# Patient Record
Sex: Female | Born: 1965 | Hispanic: No | Marital: Married | Smoking: Never smoker
Health system: Southern US, Community
[De-identification: ages and names within clinical notes are randomized; demographics above are authoritative.]

## PROBLEM LIST (undated history)

## (undated) DIAGNOSIS — E785 Hyperlipidemia, unspecified: Secondary | ICD-10-CM

## (undated) DIAGNOSIS — E119 Type 2 diabetes mellitus without complications: Secondary | ICD-10-CM

## (undated) DIAGNOSIS — M109 Gout, unspecified: Secondary | ICD-10-CM

## (undated) DIAGNOSIS — M25559 Pain in unspecified hip: Secondary | ICD-10-CM

## (undated) DIAGNOSIS — M549 Dorsalgia, unspecified: Secondary | ICD-10-CM

## (undated) DIAGNOSIS — G4730 Sleep apnea, unspecified: Secondary | ICD-10-CM

## (undated) DIAGNOSIS — I10 Essential (primary) hypertension: Secondary | ICD-10-CM

## (undated) DIAGNOSIS — R0602 Shortness of breath: Secondary | ICD-10-CM

## (undated) DIAGNOSIS — E559 Vitamin D deficiency, unspecified: Secondary | ICD-10-CM

## (undated) DIAGNOSIS — Z87448 Personal history of other diseases of urinary system: Secondary | ICD-10-CM

## (undated) DIAGNOSIS — M25569 Pain in unspecified knee: Secondary | ICD-10-CM

## (undated) DIAGNOSIS — M2550 Pain in unspecified joint: Secondary | ICD-10-CM

## (undated) HISTORY — DX: Type 2 diabetes mellitus without complications: E11.9

## (undated) HISTORY — DX: Pain in unspecified knee: M25.569

## (undated) HISTORY — DX: Dorsalgia, unspecified: M54.9

## (undated) HISTORY — DX: Pain in unspecified hip: M25.559

## (undated) HISTORY — DX: Pain in unspecified joint: M25.50

## (undated) HISTORY — DX: Shortness of breath: R06.02

## (undated) HISTORY — DX: Personal history of other diseases of urinary system: Z87.448

## (undated) HISTORY — DX: Hyperlipidemia, unspecified: E78.5

## (undated) HISTORY — DX: Gout, unspecified: M10.9

## (undated) HISTORY — DX: Vitamin D deficiency, unspecified: E55.9

## (undated) HISTORY — DX: Sleep apnea, unspecified: G47.30

## (undated) HISTORY — DX: Essential (primary) hypertension: I10

---

## 2010-06-25 ENCOUNTER — Encounter: Payer: Self-pay | Admitting: Family Medicine

## 2010-06-25 ENCOUNTER — Ambulatory Visit (INDEPENDENT_AMBULATORY_CARE_PROVIDER_SITE_OTHER): Payer: Self-pay | Admitting: Family Medicine

## 2010-06-25 DIAGNOSIS — E785 Hyperlipidemia, unspecified: Secondary | ICD-10-CM | POA: Insufficient documentation

## 2010-06-25 DIAGNOSIS — I10 Essential (primary) hypertension: Secondary | ICD-10-CM | POA: Insufficient documentation

## 2010-06-25 MED ORDER — BENAZEPRIL-HYDROCHLOROTHIAZIDE 5-6.25 MG PO TABS: 1.0000 | ORAL_TABLET | Freq: Every day | ORAL | Status: DC

## 2010-06-25 MED ORDER — BISOPROLOL-HYDROCHLOROTHIAZIDE 5-6.25 MG PO TABS: 1.0000 | ORAL_TABLET | Freq: Every day | ORAL | Status: DC

## 2010-06-25 NOTE — Assessment & Plan Note (Signed)
Unchanged. Advised pt to call when she gets health insurance to get lab work, CPX. The patient indicates understanding of these issues and agrees with the plan. Refilled BP medication today.  Strongly urged BMET today but pt declined.

## 2010-06-25 NOTE — Progress Notes (Signed)
45 yo here to establish care.  Lost her job last year.  Worked for [employer].  Has no insurance so would like to only address HTN today.  HTN- has been on Bisoprolol/HCTZ 06/24/23 mg daily for years. NO CP, SOB, LE edema. No visual changes.    Has not had blood work since 2010 but cannot afford any additional medical testing at this time.  The PMH, PSH, Social History, Family History, Medications, and allergies have been reviewed in [facility], and have been updated if relevant.  ROS: See HPI Patient reports no vision/ hearing  changes, adenopathy,fever, weight change,  persistant / recurrent hoarseness , swallowing issues, chest pain,palpitations,edema,persistant /recurrent cough, hemoptysis, dyspnea( rest/ exertional/paroxysmal nocturnal), gastrointestinal bleeding(melena, rectal bleeding), abdominal pain, significant heartburn boel changes,GU symptoms(dysuria, hematuria,pyuria, incontinence) ), Gyn symptoms(abnormal  bleeding , pain),  syncope, focal weakness, memory loss,numbness & tingling, skin/hair /nail changes,abnormal bruising or bleeding, anxiety,or depression.  Physical exam: BP 138/90  Pulse 71  Temp(Src) 97.6 F (36.4 C) (Oral)  Ht 5\' 4"  (1.626 m)  Wt 254 lb 12.8 oz (115.577 kg)  BMI 43.74 kg/m2  LMP 05/20/2010 Gen:   Alert, overweight appearing, NAD head:  normocephalic and atraumatic.   Eyes:  vision grossly intact, pupils equal, pupils round, and pupils reactive to light.     Nose:  no external deformity.   Mouth:  good dentition.   Neck:  No deformities, masses, or tenderness noted. Breasts:  No mass, nodules, thickening, tenderness, bulging, retraction, inflamation, nipple discharge or skin changes noted.   Lungs:  Normal respiratory effort, chest expands symmetrically. Lungs are clear to auscultation, no crackles or wheezes. Heart:  Normal rate and regular rhythm. S1 and S2 normal without gallop, murmur, click, rub or other extra sounds. Abdomen:  Bowel  sounds positive,abdomen soft and non-tender without masses, organomegaly or hernias noted. Extremities:  No clubbing, cyanosis, edema, or deformity noted with normal full range of motion of all joints.   Neurologic:  alert & oriented X3 and gait normal.   Skin:  Intact without suspicious lesions or rashes Psych:  Cognition and judgment appear intact. Alert and cooperative with normal attention span and concentration. No apparent delusions, illusions, hallucinations

## 2011-06-12 ENCOUNTER — Other Ambulatory Visit: Payer: Self-pay | Admitting: Family Medicine

## 2011-10-04 ENCOUNTER — Encounter: Payer: Self-pay | Admitting: Family Medicine

## 2011-10-04 ENCOUNTER — Ambulatory Visit (INDEPENDENT_AMBULATORY_CARE_PROVIDER_SITE_OTHER): Payer: BC Managed Care – PPO | Admitting: Family Medicine

## 2011-10-04 VITALS — BP 118/82 | HR 56 | Temp 97.8°F | Wt 229.0 lb

## 2011-10-04 DIAGNOSIS — E785 Hyperlipidemia, unspecified: Secondary | ICD-10-CM

## 2011-10-04 DIAGNOSIS — Z1231 Encounter for screening mammogram for malignant neoplasm of breast: Secondary | ICD-10-CM

## 2011-10-04 DIAGNOSIS — I10 Essential (primary) hypertension: Secondary | ICD-10-CM

## 2011-10-04 MED ORDER — BISOPROLOL-HYDROCHLOROTHIAZIDE 5-6.25 MG PO TABS: ORAL_TABLET | ORAL | Status: DC

## 2011-10-04 NOTE — Progress Notes (Signed)
46 yo here for follow up HTN.   HTN- has been on Bisoprolol/HCTZ 06/24/23 mg daily for years. NO CP, SOB, LE edema. No visual changes.    She has lost 30 pounds on weight watchers!  Feels great! Wt Readings from Last 3 Encounters:  10/04/11 229 lb (103.874 kg)  06/25/10 254 lb 12.8 oz (115.577 kg)   Patient Active Problem List  Diagnosis  . Hypertension  . Hyperlipidemia   Past Medical History  Diagnosis Date  . Hypertension   . Hyperlipidemia    No past surgical history on file. History  Substance Use Topics  . Smoking status: Never Smoker   . Smokeless tobacco: Not on file  . Alcohol Use: Not on file   Family History  Problem Relation Age of Onset  . Pulmonary embolism Father   . Pulmonary embolism Brother    Allergies  Allergen Reactions  . Penicillins Rash    Reaction as a child   Current Outpatient Prescriptions on File Prior to Visit  Medication Sig Dispense Refill  . aspirin 81 MG tablet Take 81 mg by mouth daily.        Marland Kitchen DISCONTD: bisoprolol-hydrochlorothiazide (ZIAC) 5-6.25 MG per tablet TAKE ONE TABLET BY MOUTH ONE TIME DAILY  90 tablet  0     The PMH, PSH, Social History, Family History, Medications, and allergies have been reviewed in [facility], and have been updated if relevant.  ROS: See HPI Patient reports no vision/ hearing  changes, adenopathy,fever, weight change,  persistant / recurrent hoarseness , swallowing issues, chest pain,palpitations,edema,persistant /recurrent cough, hemoptysis, dyspnea( rest/ exertional/paroxysmal nocturnal), gastrointestinal bleeding(melena, rectal bleeding), abdominal pain, significant heartburn boel changes,GU symptoms(dysuria, hematuria,pyuria, incontinence) ), Gyn symptoms(abnormal  bleeding , pain),  syncope, focal weakness, memory loss,numbness & tingling, skin/hair /nail changes,abnormal bruising or bleeding, anxiety,or depression.  Physical exam: BP 118/82  Pulse 56  Temp 97.8 F (36.6 C)  Wt 229 lb (103.874  kg) Gen:   Alert, overweight appearing, NAD head:  normocephalic and atraumatic.   Eyes:  vision grossly intact, pupils equal, pupils round, and pupils reactive to light.    Nose:  no external deformity.   Mouth:  good dentition.   Neck:  No deformities, masses, or tenderness noted.  Lungs:  Normal respiratory effort, chest expands symmetrically. Lungs are clear to auscultation, no crackles or wheezes. Heart:  Normal rate and regular rhythm. S1 and S2 normal without gallop, murmur, click, rub or other extra sounds. Psych:  Cognition and judgment appear intact. Alert and cooperative with normal attention span and concentration. No apparent delusions, illusions, hallucinations  Assessment and Plan: 1. Hypertension  Stable. Meds refilled. Congratulated her on weight loss. Comprehensive metabolic panel  2. Hyperlipidemia  Lipid Panel  3. Other screening mammogram  MM Digital Screening

## 2011-10-04 NOTE — Patient Instructions (Addendum)
Please stop by to see Olivia Moreno (up front)- she will set up your mammogram. Please make an appt for a physical. We will call you with your lab results from today. Congratulations on your weight loss!

## 2011-10-05 LAB — COMPREHENSIVE METABOLIC PANEL
ALT: 19 U/L (ref 0–35)
CO2: 29 mEq/L (ref 19–32)
Calcium: 10.1 mg/dL (ref 8.4–10.5)
Chloride: 103 mEq/L (ref 96–112)
Creat: 0.83 mg/dL (ref 0.50–1.10)
Glucose, Bld: 96 mg/dL (ref 70–99)
Total Protein: 7.1 g/dL (ref 6.0–8.3)

## 2011-10-05 LAB — LIPID PANEL
Cholesterol: 195 mg/dL (ref 0–200)
Total CHOL/HDL Ratio: 4.9 Ratio
Triglycerides: 229 mg/dL — ABNORMAL HIGH (ref <150)

## 2011-10-08 ENCOUNTER — Other Ambulatory Visit: Payer: BC Managed Care – PPO

## 2011-10-15 ENCOUNTER — Other Ambulatory Visit (HOSPITAL_COMMUNITY)
Admission: RE | Admit: 2011-10-15 | Discharge: 2011-10-15 | Disposition: A | Discharge status: Home / Self Care | Payer: BC Managed Care – PPO | Source: Ambulatory Visit | Attending: Family Medicine | Admitting: Family Medicine

## 2011-10-15 ENCOUNTER — Encounter: Payer: Self-pay | Admitting: Family Medicine

## 2011-10-15 ENCOUNTER — Ambulatory Visit (INDEPENDENT_AMBULATORY_CARE_PROVIDER_SITE_OTHER): Payer: BC Managed Care – PPO | Admitting: Family Medicine

## 2011-10-15 VITALS — BP 130/94 | HR 68 | Temp 98.1°F | Ht <= 58 in | Wt 223.0 lb

## 2011-10-15 DIAGNOSIS — Z23 Encounter for immunization: Secondary | ICD-10-CM

## 2011-10-15 DIAGNOSIS — Z01419 Encounter for gynecological examination (general) (routine) without abnormal findings: Secondary | ICD-10-CM | POA: Insufficient documentation

## 2011-10-15 DIAGNOSIS — I10 Essential (primary) hypertension: Secondary | ICD-10-CM

## 2011-10-15 DIAGNOSIS — Z0000 Encounter for general adult medical examination without abnormal findings: Secondary | ICD-10-CM

## 2011-10-15 DIAGNOSIS — Z1151 Encounter for screening for human papillomavirus (HPV): Secondary | ICD-10-CM | POA: Insufficient documentation

## 2011-10-15 DIAGNOSIS — Z139 Encounter for screening, unspecified: Secondary | ICD-10-CM

## 2011-10-15 DIAGNOSIS — Z113 Encounter for screening for infections with a predominantly sexual mode of transmission: Secondary | ICD-10-CM | POA: Insufficient documentation

## 2011-10-15 DIAGNOSIS — E785 Hyperlipidemia, unspecified: Secondary | ICD-10-CM

## 2011-10-15 NOTE — Progress Notes (Signed)
46 yo here for CPX.  G1P1, no h/o abnormal pap smears.  She has only had one period this year- thinks she is now menopausal. Having some mild hot flashes. No vaginal dryness.  HTN- has been on Bisoprolol/HCTZ 06/24/23 mg daily for years. NO CP, SOB, LE edema. No visual changes.    She has lost over 30 pounds on weight watchers!  Feels great! Wt Readings from Last 3 Encounters:  10/15/11 223 lb (101.152 kg)  10/04/11 229 lb (103.874 kg)  06/25/10 254 lb 12.8 oz (115.577 kg)    Mammogram scheduled for 10/24/2011.  Hypertriglyceridemia- Lab Results  Component Value Date   CHOL 195 10/04/2011   HDL 40 10/04/2011   LDLCALC 109* 10/04/2011   TRIG 229* 10/04/2011   CHOLHDL 4.9 10/04/2011    Patient Active Problem List  Diagnosis  . Hypertension  . Hyperlipidemia  . Routine general medical examination at a health care facility   Past Medical History  Diagnosis Date  . Hypertension   . Hyperlipidemia    No past surgical history on file. History  Substance Use Topics  . Smoking status: Never Smoker   . Smokeless tobacco: Not on file  . Alcohol Use: Not on file   Family History  Problem Relation Age of Onset  . Pulmonary embolism Father   . Pulmonary embolism Brother    Allergies  Allergen Reactions  . Penicillins Rash    Reaction as a child   Current Outpatient Prescriptions on File Prior to Visit  Medication Sig Dispense Refill  . aspirin 81 MG tablet Take 81 mg by mouth daily.        . bisoprolol-hydrochlorothiazide (ZIAC) 5-6.25 MG per tablet TAKE ONE TABLET BY MOUTH ONE TIME DAILY  90 tablet  6     The PMH, PSH, Social History, Family History, Medications, and allergies have been reviewed in [facility], and have been updated if relevant.  ROS: See HPI Patient reports no vision/ hearing  changes, adenopathy,fever, weight change,  persistant / recurrent hoarseness , swallowing issues, chest pain,palpitations,edema,persistant /recurrent cough, hemoptysis, dyspnea( rest/  exertional/paroxysmal nocturnal), gastrointestinal bleeding(melena, rectal bleeding), abdominal pain, significant heartburn boel changes,GU symptoms(dysuria, hematuria,pyuria, incontinence) ), Gyn symptoms(abnormal  bleeding , pain),  syncope, focal weakness, memory loss,numbness & tingling, skin/hair /nail changes,abnormal bruising or bleeding, anxiety,or depression.  Physical exam: BP 130/94  Pulse 68  Temp 98.1 F (36.7 C)  Ht 4' 4.5" (1.334 m)  Wt 223 lb (101.152 kg)  BMI 56.88 kg/m2  General:  Well-developed,well-nourished,in no acute distress; alert,appropriate and cooperative throughout examination Head:  normocephalic and atraumatic.   Eyes:  vision grossly intact, pupils equal, pupils round, and pupils reactive to light.   Ears:  R ear normal and L ear normal.   Nose:  no external deformity.   Mouth:  good dentition.   Neck:  No deformities, masses, or tenderness noted. Breasts:  No mass, nodules, thickening, tenderness, bulging, retraction, inflamation, nipple discharge or skin changes noted.   Lungs:  Normal respiratory effort, chest expands symmetrically. Lungs are clear to auscultation, no crackles or wheezes. Heart:  Normal rate and regular rhythm. S1 and S2 normal without gallop, murmur, click, rub or other extra sounds. Abdomen:  Bowel sounds positive,abdomen soft and non-tender without masses, organomegaly or hernias noted. Rectal:  no external abnormalities.   Genitalia:  Pelvic Exam:        External: normal female genitalia without lesions or masses        Vagina: normal without lesions or  masses        Cervix: normal without lesions or masses        Adnexa: normal bimanual exam without masses or fullness        Uterus: normal by palpation        Pap smear: performed Msk:  No deformity or scoliosis noted of thoracic or lumbar spine.   Extremities:  No clubbing, cyanosis, edema, or deformity noted with normal full range of motion of all joints.   Neurologic:  alert &  oriented X3 and gait normal.   Skin:  Intact without suspicious lesions or rashes Cervical Nodes:  No lymphadenopathy noted Axillary Nodes:  No palpable lymphadenopathy Psych:  Cognition and judgment appear intact. Alert and cooperative with normal attention span and concentration. No apparent delusions, illusions, hallucinations   Assessment and Plan:  1. Routine general medical examination at a health care facility  Reviewed preventive care protocols, scheduled due services, and updated immunizations Discussed nutrition, exercise, diet, and healthy lifestyle.   2. Hyperlipidemia  Deteriorated. TG elevated.   Hopefully will improve with continued weight loss. Will recheck lipids in 3-6 months.  3. Hypertension  Stable on current meds.  4. Routine gynecological examination  Pap today. Pt requests screening GC/Chlamydia as well- no known exposure to STDs or symptoms.

## 2011-10-15 NOTE — Patient Instructions (Addendum)
Great to see you. Your triglycerides were a little elevated.  Weight loss (even small amount) can decrease triglycerides.  Decrease added sugars, eliminate trans fats, increase fiber and limit alcohol.  All these changes together can drop triglycerides by almost 50%. Please come back in 3-6 months to recheck your cholesterol.  We will call you or send you a letter (if normal) with your pap smear results.

## 2011-10-15 NOTE — Addendum Note (Signed)
Addended by: Eliezer Bottom on: 10/15/2011 10:05 AM   Modules accepted: Orders

## 2011-10-22 ENCOUNTER — Encounter: Payer: Self-pay | Admitting: Family Medicine

## 2011-10-22 ENCOUNTER — Encounter: Payer: Self-pay | Admitting: *Deleted

## 2011-10-24 ENCOUNTER — Encounter: Payer: Self-pay | Admitting: Family Medicine

## 2011-10-24 ENCOUNTER — Encounter: Payer: Self-pay | Admitting: *Deleted

## 2012-03-06 ENCOUNTER — Other Ambulatory Visit (INDEPENDENT_AMBULATORY_CARE_PROVIDER_SITE_OTHER): Payer: BC Managed Care – PPO

## 2012-03-06 DIAGNOSIS — E785 Hyperlipidemia, unspecified: Secondary | ICD-10-CM

## 2012-03-06 LAB — LIPID PANEL
HDL: 37.1 mg/dL — ABNORMAL LOW (ref >39.00)
LDL Cholesterol: 129 mg/dL — ABNORMAL HIGH (ref 0–99)
Total CHOL/HDL Ratio: 5
Triglycerides: 172 mg/dL — ABNORMAL HIGH (ref 0.0–149.0)

## 2012-03-06 NOTE — Addendum Note (Signed)
Addended by: Baldomero Lamy on: 03/06/2012 08:04 AM   Modules accepted: Orders

## 2012-08-12 ENCOUNTER — Ambulatory Visit (INDEPENDENT_AMBULATORY_CARE_PROVIDER_SITE_OTHER): Payer: BC Managed Care – PPO | Admitting: Family Medicine

## 2012-08-12 ENCOUNTER — Encounter: Payer: Self-pay | Admitting: Family Medicine

## 2012-08-12 VITALS — BP 140/98 | HR 98 | Temp 98.0°F | Wt 254.0 lb

## 2012-08-12 DIAGNOSIS — R21 Rash and other nonspecific skin eruption: Secondary | ICD-10-CM | POA: Insufficient documentation

## 2012-08-12 DIAGNOSIS — R609 Edema, unspecified: Secondary | ICD-10-CM | POA: Insufficient documentation

## 2012-08-12 DIAGNOSIS — R600 Localized edema: Secondary | ICD-10-CM | POA: Insufficient documentation

## 2012-08-12 NOTE — Progress Notes (Signed)
  Subjective:    Patient ID: Olivia Moreno, female    DOB: 15-Nov-1965, 47 y.o.   MRN: [id number]  HPI 47 year old overweight female presents with B peripheral edema x 2 months. When swelling was occuring she felt is was venous insufficiency.   In last 24 hour she has noted a rash on B ankles and feet..  No itchiness. Feeling well otherwise.  No SOB, No CP, No fever. No joint swelling. No change in urination. She has some fatigue, hot intolerance. HAs regained 30 lbs in last 8 months.  no new exposures or lotion.  Nml liver and kidney function test in 09/2011.  No thyroid.  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  No family history of thyroid issues.    Review of Systems  Constitutional: Negative for fever and fatigue.  HENT: Negative for ear pain.   Eyes: Negative for pain.  Respiratory: Negative for chest tightness and shortness of breath.   Cardiovascular: Negative for chest pain and palpitations.  Gastrointestinal: Negative for abdominal pain.  Genitourinary: Negative for dysuria.       Objective:   Physical Exam  Constitutional: Vital signs are normal. She appears well-developed and well-nourished. She is cooperative.  Non-toxic appearance. She does not appear ill. No distress.  HENT:  Head: Normocephalic.  Right Ear: Hearing, tympanic membrane, external ear and ear canal normal. Tympanic membrane is not erythematous, not retracted and not bulging.  Left Ear: Hearing, tympanic membrane, external ear and ear canal normal. Tympanic membrane is not erythematous, not retracted and not bulging.  Nose: No mucosal edema or rhinorrhea. Right sinus exhibits no maxillary sinus tenderness and no frontal sinus tenderness. Left sinus exhibits no maxillary sinus tenderness and no frontal sinus tenderness.  Mouth/Throat: Uvula is midline, oropharynx is clear and moist and mucous membranes are normal.  Eyes: Conjunctivae, EOM and lids are normal. Pupils are equal, round,  and reactive to light. No foreign bodies found.  Neck: Trachea normal and normal range of motion. Neck supple. Carotid bruit is not present. No mass and no thyromegaly present.  Cardiovascular: Normal rate, regular rhythm, S1 normal, S2 normal, normal heart sounds, intact distal pulses and normal pulses.  Exam reveals no gallop and no friction rub.   No murmur heard. Pulmonary/Chest: Effort normal and breath sounds normal. Not tachypneic. No respiratory distress. She has no decreased breath sounds. She has no wheezes. She has no rhonchi. She has no rales.  Abdominal: Soft. Normal appearance and bowel sounds are normal. There is no tenderness.  Neurological: She is alert.  Skin: Skin is warm, dry and intact. No rash noted.  1 plus nonpitting edema in B legs Nonblanching pinpoint rash on B ankles  Psychiatric: Her speech is normal and behavior is normal. Judgment and thought content normal. Her mood appears not anxious. Cognition and memory are normal. She does not exhibit a depressed mood.          Assessment & Plan:

## 2012-08-12 NOTE — Patient Instructions (Addendum)
Stop at the lab.. We will call you with those results.  Start wearing compression hose.. 15-30 mmHG.  Elevate feet above heart.  Work on exercise and weight loss.

## 2012-08-13 LAB — CBC WITH DIFFERENTIAL/PLATELET
Basophils Absolute: 0 10*3/uL (ref 0.0–0.1)
Eosinophils Absolute: 0.2 10*3/uL (ref 0.0–0.7)
Lymphocytes Relative: 22.5 % (ref 12.0–46.0)
MCHC: 33.5 g/dL (ref 30.0–36.0)
MCV: 92.3 fl (ref 78.0–100.0)
Monocytes Absolute: 0.4 10*3/uL (ref 0.1–1.0)
Neutro Abs: 6.9 10*3/uL (ref 1.4–7.7)
Neutrophils Relative %: 70.5 % (ref 43.0–77.0)
RDW: 13.9 % (ref 11.5–14.6)

## 2012-08-13 LAB — COMPREHENSIVE METABOLIC PANEL
ALT: 29 U/L (ref 0–35)
AST: 30 U/L (ref 0–37)
Alkaline Phosphatase: 82 U/L (ref 39–117)
Calcium: 9.9 mg/dL (ref 8.4–10.5)
Chloride: 104 mEq/L (ref 96–112)
Creatinine, Ser: 1 mg/dL (ref 0.4–1.2)

## 2012-08-20 NOTE — Assessment & Plan Note (Addendum)
Nonblanching rash on legs that appear secondary to peripheral edema. WIll eval with labs.   Could possibbly be a vasculitis so if progressing consider derm referral.

## 2012-08-20 NOTE — Assessment & Plan Note (Signed)
Will eval with labs to rule out kidney, liver , other causes. Most likely venous insufficiency. Elevate feet, compression hose, exercise and weight loss.

## 2012-10-15 ENCOUNTER — Telehealth: Payer: Self-pay

## 2012-10-15 NOTE — Telephone Encounter (Signed)
Pt wanted to schedule screening mammogram appt; advised pt she can schedule herself and will better coordinate appt with pt's schedule. Pt will call to schedule. If any problems pt will cb.

## 2012-12-24 ENCOUNTER — Other Ambulatory Visit: Payer: Self-pay

## 2012-12-27 ENCOUNTER — Other Ambulatory Visit: Payer: Self-pay | Admitting: Family Medicine

## 2012-12-28 NOTE — Telephone Encounter (Signed)
Last office visit 08/12/2012 with Dr. Ermalene Searing.  Ok to refill?

## 2013-01-19 ENCOUNTER — Ambulatory Visit (INDEPENDENT_AMBULATORY_CARE_PROVIDER_SITE_OTHER): Payer: BC Managed Care – PPO | Admitting: Family Medicine

## 2013-01-19 ENCOUNTER — Encounter: Payer: Self-pay | Admitting: Family Medicine

## 2013-01-19 ENCOUNTER — Ambulatory Visit: Payer: Self-pay | Admitting: Family Medicine

## 2013-01-19 ENCOUNTER — Telehealth: Payer: Self-pay

## 2013-01-19 VITALS — BP 142/90 | HR 88 | Temp 98.7°F | Ht 62.5 in | Wt 262.5 lb

## 2013-01-19 DIAGNOSIS — M7989 Other specified soft tissue disorders: Secondary | ICD-10-CM

## 2013-01-19 DIAGNOSIS — J209 Acute bronchitis, unspecified: Secondary | ICD-10-CM | POA: Insufficient documentation

## 2013-01-19 MED ORDER — GUAIFENESIN-CODEINE 100-10 MG/5ML PO SYRP: 5.0000 mL | ORAL_SOLUTION | Freq: Every evening | ORAL | Status: DC | PRN

## 2013-01-19 MED ORDER — DICLOFENAC SODIUM 75 MG PO TBEC: 75.0000 mg | DELAYED_RELEASE_TABLET | Freq: Two times a day (BID) | ORAL | Status: DC

## 2013-01-19 MED ORDER — AZITHROMYCIN 250 MG PO TABS: ORAL_TABLET | ORAL | Status: DC

## 2013-01-19 NOTE — Progress Notes (Signed)
   Subjective:    Patient ID: Olivia Moreno, female    DOB: [birth year], 47 y.o.   MRN: [id number]  HPI  47 year old  Morbidly obese pt with HTN of Dr. Elmer Sow presents with  New onset pain and swelling in left ankle.  No known injury. No fall. Pain is over left lateral ankle, no calf or thigh pain.  Pain with dorsiflexion of ankle.  Some warmth and slight redness. Pain with weight bearing. Went to [place] over thanksgiving... 5 hours each way.  She has been walking a lot during her trip.  Father with DVTs and died from PE and brother with DVTs.    She has also been having 1 day runny nose, congestion , fatgiue. She has had a cough off and on for three weeks. No headache, no face pain, no era pain.  She now has thick and yellow mucus productive cough.  No fever.  No SOB, no wheeze.  Non smoker, no COPD, no asthma.  SUing nasal saline, zicam and nyquil.      Review of Systems  Constitutional: Negative for fever and fatigue.  HENT: Positive for congestion. Negative for ear pain.   Eyes: Negative for pain.  Respiratory: Positive for cough. Negative for chest tightness and shortness of breath.   Cardiovascular: Positive for leg swelling. Negative for chest pain and palpitations.  Gastrointestinal: Negative for abdominal pain.  Genitourinary: Negative for dysuria.       Objective:   Physical Exam  Constitutional: Vital signs are normal. She appears well-developed and well-nourished. She is cooperative.  Non-toxic appearance. She does not appear ill. No distress.  Morbid obesity  HENT:  Head: Normocephalic.  Right Ear: Hearing, tympanic membrane, external ear and ear canal normal. Tympanic membrane is not erythematous, not retracted and not bulging.  Left Ear: Hearing, tympanic membrane, external ear and ear canal normal. Tympanic membrane is not erythematous, not retracted and not bulging.  Nose: Mucosal edema and rhinorrhea present. Right sinus exhibits no maxillary sinus tenderness  and no frontal sinus tenderness. Left sinus exhibits no maxillary sinus tenderness and no frontal sinus tenderness.  Mouth/Throat: Uvula is midline, oropharynx is clear and moist and mucous membranes are normal.  Eyes: Conjunctivae, EOM and lids are normal. Pupils are equal, round, and reactive to light. Lids are everted and swept, no foreign bodies found.  Neck: Trachea normal and normal range of motion. Neck supple. Carotid bruit is not present. No mass and no thyromegaly present.  Cardiovascular: Normal rate, regular rhythm, S1 normal, S2 normal, normal heart sounds, intact distal pulses and normal pulses.  Exam reveals no gallop and no friction rub.   No murmur heard. Pulmonary/Chest: Effort normal and breath sounds normal. Not tachypneic. No respiratory distress. She has no decreased breath sounds. She has no wheezes. She has no rhonchi. She has no rales.  Musculoskeletal:  Left leg red and swollen calf to foot.  PAin in dorsal foot over talus anteriorly. No pain over lateral or medial malleolus  Neurological: She is alert.  Skin: Skin is warm, dry and intact. No rash noted.  Psychiatric: Her speech is normal and behavior is normal. Judgment normal. Her mood appears not anxious. Cognition and memory are normal. She does not exhibit a depressed mood.          Assessment & Plan:

## 2013-01-19 NOTE — Assessment & Plan Note (Signed)
Will eval for DVT given pt family history of recent travel as well as obesity.   May be secondary to ankle injury... If Dopplers negative will treat with brace elevation, ice and NSAIDs.

## 2013-01-19 NOTE — Progress Notes (Signed)
Pre-visit discussion using our clinic review tool. No additional management support is needed unless otherwise documented below in the visit note.  

## 2013-01-19 NOTE — Telephone Encounter (Signed)
I sent in diclofenac to use BID. Please mail her ankle sprain rehab exercises.

## 2013-01-19 NOTE — Telephone Encounter (Signed)
Amber with [facility] [place] called report  Lt leg [place] was negative for blood clot and no Baker's Cyst. Pt waiting. Dr Ermalene Searing said advise pt everything was fine;Musculoskeletal. Dr Ermalene Searing will call in antiinflammatory medication, pt should elevate foot and ice ankle. Pt to get pull on soft ankle brace for support at pharmacy and [facility] will mail home PT to patient. If no improvement in 2 weeks pt to cb for f/u appt.

## 2013-01-19 NOTE — Patient Instructions (Signed)
Mucinex DM during the day. Prescription cough suppressant at night. Start and complete antibiotics. Stop at the front desk to set up doppler US of left leg today.

## 2013-01-20 ENCOUNTER — Encounter: Payer: Self-pay | Admitting: Family Medicine

## 2013-01-20 NOTE — Telephone Encounter (Signed)
Patient notified as instructed by telelphone.  Sprain Ankle Rehab Exercises mailed.

## 2013-04-22 ENCOUNTER — Other Ambulatory Visit: Payer: Self-pay | Admitting: Internal Medicine

## 2013-07-26 ENCOUNTER — Telehealth: Payer: Self-pay | Admitting: Family Medicine

## 2013-07-26 MED ORDER — BISOPROLOL-HYDROCHLOROTHIAZIDE 5-6.25 MG PO TABS: 1.0000 | ORAL_TABLET | Freq: Every day | ORAL | Status: DC

## 2013-07-26 NOTE — Telephone Encounter (Signed)
Lm on pts vm informing her 30D supply sent to local pharmacy to last until f/u appt

## 2013-07-26 NOTE — Telephone Encounter (Signed)
[facility] [employer]  Pt called to make a med refill appointment  Appointment 08/13/13 @ 4 Pt stated she would be out of her bp meds prior to that appointment and wanted to know if she could get her meds prior to 6/26

## 2013-08-13 ENCOUNTER — Encounter: Payer: Self-pay | Admitting: Family Medicine

## 2013-08-13 ENCOUNTER — Ambulatory Visit (INDEPENDENT_AMBULATORY_CARE_PROVIDER_SITE_OTHER): Payer: BC Managed Care – PPO | Admitting: Family Medicine

## 2013-08-13 VITALS — BP 144/88 | HR 86 | Temp 97.7°F | Ht 62.75 in | Wt 260.5 lb

## 2013-08-13 DIAGNOSIS — M109 Gout, unspecified: Secondary | ICD-10-CM | POA: Insufficient documentation

## 2013-08-13 DIAGNOSIS — M10071 Idiopathic gout, right ankle and foot: Secondary | ICD-10-CM

## 2013-08-13 DIAGNOSIS — M7989 Other specified soft tissue disorders: Secondary | ICD-10-CM

## 2013-08-13 DIAGNOSIS — I10 Essential (primary) hypertension: Secondary | ICD-10-CM

## 2013-08-13 MED ORDER — GUAIFENESIN-CODEINE 100-10 MG/5ML PO SYRP: 5.0000 mL | ORAL_SOLUTION | Freq: Every evening | ORAL | Status: DC | PRN

## 2013-08-13 MED ORDER — BISOPROLOL-HYDROCHLOROTHIAZIDE 5-6.25 MG PO TABS: 1.0000 | ORAL_TABLET | Freq: Every day | ORAL | Status: DC

## 2013-08-13 MED ORDER — DICLOFENAC SODIUM 75 MG PO TBEC: 75.0000 mg | DELAYED_RELEASE_TABLET | Freq: Two times a day (BID) | ORAL | Status: DC

## 2013-08-13 NOTE — Progress Notes (Signed)
Pre visit review using our clinic review tool, if applicable. No additional management support is needed unless otherwise documented below in the visit note. 

## 2013-08-13 NOTE — Assessment & Plan Note (Signed)
Reasonable control on current rx. Advised her against increasing diuretic for LE given her h/o gout.

## 2013-08-13 NOTE — Assessment & Plan Note (Signed)
Venous insufficiency. Advised compression hose. See below.

## 2013-08-13 NOTE — Progress Notes (Signed)
Subjective:    Patient ID: Olivia Moreno, female    DOB: 10/20/1965, 48 y.o.   MRN: [id number]  HPI 48 year old overweight female presents for med refills with worsening  B peripheral edema for past year.  Labs unremarkable last winter- felt it was venous insufficiency.  Much better in morning.  Sits all day at work but does not elevate her feel. No CP or SOB.  Lab Results  Component Value Date   WBC 9.8 08/12/2012   HGB 14.6 08/12/2012   HCT 43.5 08/12/2012   MCV 92.3 08/12/2012   PLT 260.0 08/12/2012   Lab Results  Component Value Date   TSH 2.64 08/12/2012   Lab Results  Component Value Date   NA 139 08/12/2012   K 4.4 08/12/2012   CL 104 08/12/2012   CO2 27 08/12/2012    Does have h/o gout.  Wants to increase her diuretic in her BP medication.    Review of Systems  Constitutional: Negative for fever and fatigue.  HENT: Negative for ear pain.   Eyes: Negative for pain.  Respiratory: Negative for chest tightness and shortness of breath.   Cardiovascular: Negative for chest pain and palpitations.  Gastrointestinal: Negative for abdominal pain.  Genitourinary: Negative for dysuria.       Objective:   Physical Exam  Constitutional: Vital signs are normal. She appears well-developed and well-nourished. She is cooperative.  Non-toxic appearance. She does not appear ill. No distress.  HENT:  Head: Normocephalic.  Right Ear: Hearing, tympanic membrane, external ear and ear canal normal. Tympanic membrane is not erythematous, not retracted and not bulging.  Left Ear: Hearing, tympanic membrane, external ear and ear canal normal. Tympanic membrane is not erythematous, not retracted and not bulging.  Nose: No mucosal edema or rhinorrhea. Right sinus exhibits no maxillary sinus tenderness and no frontal sinus tenderness. Left sinus exhibits no maxillary sinus tenderness and no frontal sinus tenderness.  Mouth/Throat: Uvula is midline, oropharynx is clear and moist and mucous  membranes are normal.  Eyes: Conjunctivae, EOM and lids are normal. Pupils are equal, round, and reactive to light. Lids are everted and swept, no foreign bodies found.  Neck: Trachea normal and normal range of motion. Neck supple. Carotid bruit is not present. No mass and no thyromegaly present.  Cardiovascular: Normal rate, regular rhythm, S1 normal, S2 normal, normal heart sounds, intact distal pulses and normal pulses.  Exam reveals no gallop and no friction rub.   No murmur heard. Pulmonary/Chest: Effort normal and breath sounds normal. Not tachypneic. No respiratory distress. She has no decreased breath sounds. She has no wheezes. She has no rhonchi. She has no rales.  Abdominal: Soft. Normal appearance and bowel sounds are normal. There is no tenderness.  Neurological: She is alert.  Skin: Skin is warm, dry and intact. No rash noted.  1 plus nonpitting edema in B legs Nonblanching pinpoint rash on B ankles  Psychiatric: Her speech is normal and behavior is normal. Judgment and thought content normal. Her mood appears not anxious. Cognition and memory are normal. She does not exhibit a depressed mood.  BP 144/88  Pulse 86  Temp(Src) 97.7 F (36.5 C) (Oral)  Ht 5' 2.75" (1.594 m)  Wt 260 lb 8 oz (118.162 kg)  BMI 46.51 kg/m2  SpO2 95%  LMP 05/20/2010 BP Readings from Last 3 Encounters:  08/13/13 144/88  01/19/13 142/90  08/12/12 140/98           Assessment & Plan:

## 2013-08-16 ENCOUNTER — Telehealth: Payer: Self-pay | Admitting: Family Medicine

## 2013-08-16 NOTE — Telephone Encounter (Signed)
Relevant patient education assigned to patient using Emmi. ° °

## 2013-11-08 ENCOUNTER — Other Ambulatory Visit (INDEPENDENT_AMBULATORY_CARE_PROVIDER_SITE_OTHER): Payer: BC Managed Care – PPO

## 2013-11-08 DIAGNOSIS — Z0000 Encounter for general adult medical examination without abnormal findings: Secondary | ICD-10-CM

## 2013-11-08 DIAGNOSIS — I10 Essential (primary) hypertension: Secondary | ICD-10-CM

## 2013-11-08 DIAGNOSIS — E785 Hyperlipidemia, unspecified: Secondary | ICD-10-CM

## 2013-11-08 LAB — COMPREHENSIVE METABOLIC PANEL
ALBUMIN: 3.9 g/dL (ref 3.5–5.2)
ALK PHOS: 80 U/L (ref 39–117)
ALT: 38 U/L — AB (ref 0–35)
AST: 38 U/L — ABNORMAL HIGH (ref 0–37)
BUN: 23 mg/dL (ref 6–23)
CO2: 29 mEq/L (ref 19–32)
Calcium: 9.4 mg/dL (ref 8.4–10.5)
Chloride: 109 mEq/L (ref 96–112)
Creatinine, Ser: 1 mg/dL (ref 0.4–1.2)
GFR: 59.85 mL/min — ABNORMAL LOW (ref >60.00)
Glucose, Bld: 138 mg/dL — ABNORMAL HIGH (ref 70–99)
POTASSIUM: 4.2 meq/L (ref 3.5–5.1)
SODIUM: 143 meq/L (ref 135–145)
TOTAL PROTEIN: 7.5 g/dL (ref 6.0–8.3)
Total Bilirubin: 0.3 mg/dL (ref 0.2–1.2)

## 2013-11-08 LAB — CBC WITH DIFFERENTIAL/PLATELET
BASOS PCT: 0.5 % (ref 0.0–3.0)
Basophils Absolute: 0 10*3/uL (ref 0.0–0.1)
Eosinophils Absolute: 0.2 10*3/uL (ref 0.0–0.7)
Eosinophils Relative: 2.3 % (ref 0.0–5.0)
HEMATOCRIT: 40.8 % (ref 36.0–46.0)
Hemoglobin: 13.5 g/dL (ref 12.0–15.0)
LYMPHS ABS: 1.9 10*3/uL (ref 0.7–4.0)
Lymphocytes Relative: 23.2 % (ref 12.0–46.0)
MCHC: 33 g/dL (ref 30.0–36.0)
MCV: 92.8 fl (ref 78.0–100.0)
MONO ABS: 0.5 10*3/uL (ref 0.1–1.0)
Monocytes Relative: 6.5 % (ref 3.0–12.0)
Neutro Abs: 5.6 10*3/uL (ref 1.4–7.7)
Neutrophils Relative %: 67.5 % (ref 43.0–77.0)
Platelets: 230 10*3/uL (ref 150.0–400.0)
RBC: 4.4 Mil/uL (ref 3.87–5.11)
RDW: 14.6 % (ref 11.5–15.5)
WBC: 8.2 10*3/uL (ref 4.0–10.5)

## 2013-11-08 LAB — LIPID PANEL
Cholesterol: 199 mg/dL (ref 0–200)
HDL: 31.6 mg/dL — ABNORMAL LOW (ref >39.00)
NonHDL: 167.4
Total CHOL/HDL Ratio: 6
Triglycerides: 276 mg/dL — ABNORMAL HIGH (ref 0.0–149.0)
VLDL: 55.2 mg/dL — AB (ref 0.0–40.0)

## 2013-11-08 LAB — LDL CHOLESTEROL, DIRECT: Direct LDL: 140.8 mg/dL

## 2013-11-08 LAB — TSH: TSH: 2.61 u[IU]/mL (ref 0.35–4.50)

## 2013-11-15 ENCOUNTER — Other Ambulatory Visit (HOSPITAL_COMMUNITY)
Admission: RE | Admit: 2013-11-15 | Discharge: 2013-11-15 | Disposition: A | Discharge status: Home / Self Care | Payer: BC Managed Care – PPO | Source: Ambulatory Visit | Attending: Family Medicine | Admitting: Family Medicine

## 2013-11-15 ENCOUNTER — Ambulatory Visit (INDEPENDENT_AMBULATORY_CARE_PROVIDER_SITE_OTHER): Payer: BC Managed Care – PPO | Admitting: Family Medicine

## 2013-11-15 ENCOUNTER — Encounter: Payer: Self-pay | Admitting: Family Medicine

## 2013-11-15 VITALS — BP 116/68 | HR 77 | Temp 98.4°F | Ht 62.5 in | Wt 266.5 lb

## 2013-11-15 DIAGNOSIS — I10 Essential (primary) hypertension: Secondary | ICD-10-CM

## 2013-11-15 DIAGNOSIS — E785 Hyperlipidemia, unspecified: Secondary | ICD-10-CM

## 2013-11-15 DIAGNOSIS — E1165 Type 2 diabetes mellitus with hyperglycemia: Secondary | ICD-10-CM | POA: Insufficient documentation

## 2013-11-15 DIAGNOSIS — N760 Acute vaginitis: Secondary | ICD-10-CM | POA: Insufficient documentation

## 2013-11-15 DIAGNOSIS — Z113 Encounter for screening for infections with a predominantly sexual mode of transmission: Secondary | ICD-10-CM | POA: Insufficient documentation

## 2013-11-15 DIAGNOSIS — Z01419 Encounter for gynecological examination (general) (routine) without abnormal findings: Secondary | ICD-10-CM

## 2013-11-15 DIAGNOSIS — E669 Obesity, unspecified: Secondary | ICD-10-CM | POA: Insufficient documentation

## 2013-11-15 DIAGNOSIS — R7309 Other abnormal glucose: Secondary | ICD-10-CM

## 2013-11-15 DIAGNOSIS — Z1151 Encounter for screening for human papillomavirus (HPV): Secondary | ICD-10-CM | POA: Diagnosis present

## 2013-11-15 DIAGNOSIS — G4733 Obstructive sleep apnea (adult) (pediatric): Secondary | ICD-10-CM

## 2013-11-15 DIAGNOSIS — Z9989 Dependence on other enabling machines and devices: Secondary | ICD-10-CM

## 2013-11-15 DIAGNOSIS — R739 Hyperglycemia, unspecified: Secondary | ICD-10-CM

## 2013-11-15 DIAGNOSIS — Z0000 Encounter for general adult medical examination without abnormal findings: Secondary | ICD-10-CM

## 2013-11-15 DIAGNOSIS — E119 Type 2 diabetes mellitus without complications: Secondary | ICD-10-CM | POA: Insufficient documentation

## 2013-11-15 MED ORDER — DICLOFENAC SODIUM 75 MG PO TBEC: 75.0000 mg | DELAYED_RELEASE_TABLET | Freq: Two times a day (BID) | ORAL | Status: DC

## 2013-11-15 NOTE — Assessment & Plan Note (Signed)
Reviewed preventive care protocols, scheduled due services, and updated immunizations Discussed nutrition, exercise, diet, and healthy lifestyle.  Declined influenza vaccine- she is scheduled to receive this at work.

## 2013-11-15 NOTE — Patient Instructions (Signed)
Great to see you. I will call you with your lab results.  Please stop by to see Shirlee Limerick to set up your pulmonary referral.

## 2013-11-15 NOTE — Progress Notes (Signed)
Pre visit review using our clinic review tool, if applicable. No additional management support is needed unless otherwise documented below in the visit note. 

## 2013-11-15 NOTE — Assessment & Plan Note (Signed)
Discussed USPSTF recommendations of cervical cancer screening.  She is aware that interval of 3 years is recommended but pt would prefer to have pap smear done today.  

## 2013-11-15 NOTE — Progress Notes (Signed)
48 yo pleasant female here for CPX.  G1P1, no h/o abnormal pap smears. Last pap smear done by me on 10/15/11. Last mammogram this past Thursday at [facility]. LMP 1 year ago- no bleeding since. No hot flashes or vaginal dryness.  HTN- has been on Bisoprolol/HCTZ 06/24/23 mg daily for years. NO CP, SOB, LE edema. No visual changes.    Lab Results  Component Value Date   CREATININE 1.0 11/08/2013   Hyperglycemia - Glucose on CMET was 138.  Per pt, she was fasting. She denies increased thirst or urination. No FH of DM. Admits to gaining weight.  No longer doing weight watchers- was very successful with this in past.  Wt Readings from Last 3 Encounters:  11/15/13 266 lb 8 oz (120.884 kg)  08/13/13 260 lb 8 oz (118.162 kg)  01/19/13 262 lb 8 oz (119.069 kg)   OSA with CPAP- needs a new machine.  Asks to be referred to a local gynecologist.  Hypertriglyceridemia- Lab Results  Component Value Date   CHOL 199 11/08/2013   HDL 31.60* 11/08/2013   LDLCALC 129* 03/06/2012   LDLDIRECT 140.8 11/08/2013   TRIG 276.0* 11/08/2013   CHOLHDL 6 11/08/2013    Patient Active Problem List   Diagnosis Date Noted  . Gout 08/13/2013  . Acute bronchitis 01/19/2013  . Left leg swelling 01/19/2013  . Peripheral edema 08/12/2012  . Rash and nonspecific skin eruption 08/12/2012  . Routine general medical examination at a health care facility 10/15/2011  . Routine gynecological examination 10/15/2011  . Hypertension   . Hyperlipidemia    Past Medical History  Diagnosis Date  . Hypertension   . Hyperlipidemia    No past surgical history on file. History  Substance Use Topics  . Smoking status: Never Smoker   . Smokeless tobacco: Never Used  . Alcohol Use: No   Family History  Problem Relation Age of Onset  . Pulmonary embolism Father   . Pulmonary embolism Brother    Allergies  Allergen Reactions  . Penicillins Rash    Reaction as a child   Current Outpatient Prescriptions on  File Prior to Visit  Medication Sig Dispense Refill  . bisoprolol-hydrochlorothiazide (ZIAC) 5-6.25 MG per tablet Take 1 tablet by mouth daily.  90 tablet  1  . guaiFENesin-codeine (ROBITUSSIN AC) 100-10 MG/5ML syrup Take 5-10 mLs by mouth at bedtime as needed for cough.  180 mL  0   No current facility-administered medications on file prior to visit.     The PMH, PSH, Social History, Family History, Medications, and allergies have been reviewed in [facility], and have been updated if relevant.  ROS: See HPI +chronic joint pain  Patient reports no vision/ hearing  changes, adenopathy,fever, weight change,  persistant / recurrent hoarseness , swallowing issues, chest pain,palpitations,edema,persistant /recurrent cough, hemoptysis, dyspnea( rest/ exertional/paroxysmal nocturnal), gastrointestinal bleeding(melena, rectal bleeding), abdominal pain, significant heartburn boel changes,GU symptoms(dysuria, hematuria,pyuria, incontinence) ), Gyn symptoms(abnormal  bleeding , pain),  syncope, focal weakness, memory loss,numbness & tingling, skin/hair /nail changes,abnormal bruising or bleeding, anxiety,or depression.  Physical exam: BP 116/68  Pulse 77  Temp(Src) 98.4 F (36.9 C) (Oral)  Ht 5' 2.5" (1.588 m)  Wt 266 lb 8 oz (120.884 kg)  BMI 47.94 kg/m2  SpO2 95%  LMP 05/20/2010  General:  Obese, Well-developed,well-nourished,in no acute distress; alert,appropriate and cooperative throughout examination Head:  normocephalic and atraumatic.   Eyes:  vision grossly intact, pupils equal, pupils round, and pupils reactive to light.   Ears:  R ear normal and L ear normal.   Nose:  no external deformity.   Mouth:  good dentition.   Neck:  No deformities, masses, or tenderness noted. Breasts:  No mass, nodules, thickening, tenderness, bulging, retraction, inflamation, nipple discharge or skin changes noted.   Lungs:  Normal respiratory effort, chest expands symmetrically. Lungs are clear to  auscultation, no crackles or wheezes. Heart:  Normal rate and regular rhythm. S1 and S2 normal without gallop, murmur, click, rub or other extra sounds. Abdomen:  Bowel sounds positive,abdomen soft and non-tender without masses, organomegaly or hernias noted. Rectal:  no external abnormalities.   Genitalia:  Pelvic Exam:        External: normal female genitalia without lesions or masses        Vagina: normal without lesions or masses        Cervix: normal without lesions or masses        Adnexa: normal bimanual exam without masses or fullness        Uterus: normal by palpation        Pap smear: performed Msk:  No deformity or scoliosis noted of thoracic or lumbar spine.   Extremities:  No clubbing, cyanosis, edema, or deformity noted with normal full range of motion of all joints.   Neurologic:  alert & oriented X3 and gait normal.   Skin:  Intact without suspicious lesions or rashes Cervical Nodes:  No lymphadenopathy noted Axillary Nodes:  No palpable lymphadenopathy Psych:  Cognition and judgment appear intact. Alert and cooperative with normal attention span and concentration. No apparent delusions, illusions, hallucinations

## 2013-11-15 NOTE — Assessment & Plan Note (Signed)
New- if truly fasting, she is a diabetic but will confirm with a1c prior to tx. The patient indicates understanding of these issues and agrees with the plan.

## 2013-11-15 NOTE — Addendum Note (Signed)
Addended by: Desmond Dike on: 11/15/2013 03:42 PM   Modules accepted: Orders

## 2013-11-15 NOTE — Assessment & Plan Note (Signed)
Deteriorated. Will need to start a statin if she is a diabetic. Low cholesterol hand out given as well.

## 2013-11-15 NOTE — Assessment & Plan Note (Signed)
Referred to pulmonary

## 2013-11-16 LAB — HEMOGLOBIN A1C: Hgb A1c MFr Bld: 6.2 % (ref 4.6–6.5)

## 2013-11-17 LAB — CYTOLOGY - PAP

## 2013-11-18 ENCOUNTER — Encounter: Payer: Self-pay | Admitting: *Deleted

## 2013-11-19 LAB — CERVICOVAGINAL ANCILLARY ONLY
Bacterial vaginitis: NEGATIVE
CANDIDA VAGINITIS: NEGATIVE
HERPES (WINDOWPATH): NEGATIVE

## 2013-12-01 ENCOUNTER — Ambulatory Visit: Payer: BC Managed Care – PPO | Admitting: Family Medicine

## 2013-12-02 ENCOUNTER — Encounter: Payer: Self-pay | Admitting: Family Medicine

## 2013-12-02 ENCOUNTER — Ambulatory Visit (INDEPENDENT_AMBULATORY_CARE_PROVIDER_SITE_OTHER): Payer: BC Managed Care – PPO | Admitting: Family Medicine

## 2013-12-02 VITALS — BP 118/80 | HR 88 | Temp 98.2°F | Ht 62.5 in | Wt 255.5 lb

## 2013-12-02 DIAGNOSIS — M76821 Posterior tibial tendinitis, right leg: Secondary | ICD-10-CM

## 2013-12-02 DIAGNOSIS — M19079 Primary osteoarthritis, unspecified ankle and foot: Secondary | ICD-10-CM

## 2013-12-02 DIAGNOSIS — M1990 Unspecified osteoarthritis, unspecified site: Secondary | ICD-10-CM

## 2013-12-02 NOTE — Patient Instructions (Signed)
Posterior Tib and arch rehab   Begin with easy walking, heel, toe and backwards * Try to pick an easy location like a hallway or a room in your house and do one of these each time that you go through this area.  Towel "Scrunch Ups" Use a hand towel or a moderate size towel Foot flat down on the towel Use toes to "scrunch up the towel" straight up and down, and going to the right and left.  3 sets of 20 * Can be done watching TV, reading, or sitting and relaxing.  

## 2013-12-02 NOTE — Progress Notes (Signed)
Pre visit review using our clinic review tool, if applicable. No additional management support is needed unless otherwise documented below in the visit note. 

## 2013-12-02 NOTE — Progress Notes (Signed)
Dr. Karleen HampshireSpencer T. Braun Rocca, MD, CAQ Sports Medicine [facility] [address] [place], [address] Phone: [phone] Fax: [phone]  12/02/2013  Patient: Olivia Moreno Overby, MRN: [id number], DOB: 02/16/1966, 48 y.o.  Primary Physician:  Ruthe Mannanalia Aron, MD  Chief Complaint: Ankle Pain  Subjective:   Olivia Moreno Dobler is a 48 y.o. very pleasant female patient with a BMI of 46 who presents with the following:  Right ankle has been hurting for a long time with an insidious onset and no injury. Ankle has been bothering her for many months - up to a year and it is more on the medial side. NO trauma or accident, prior significant injury, fracture, or surgery. Difficulty with basic ambulation, moving, going up and down stairs and with much movement.  ASO ankle brace Scaphoid pad  Past Medical History, Surgical History, Social History, Family History, Problem List, Medications, and Allergies have been reviewed and updated if relevant.  GEN: No fevers, chills. Nontoxic. Primarily MSK c/o today. MSK: Detailed in the HPI GI: tolerating PO intake without difficulty Neuro: No numbness, parasthesias, or tingling associated. Otherwise the pertinent positives of the ROS are noted above.   Objective:   BP 118/80  Pulse 88  Temp(Src) 98.2 F (36.8 C) (Oral)  Ht 5' 2.5" (1.588 m)  Wt 255 lb 8 oz (115.894 kg)  BMI 45.96 kg/m2  LMP 05/20/2010   GEN: WDWN, NAD, Non-toxic, Alert & Oriented x 3 HEENT: Atraumatic, Normocephalic.  Ears and Nose: No external deformity. EXTR: No clubbing/cyanosis/edema PSYCH: Normally interactive. Conversant. Not depressed or anxious appearing.  Calm demeanor.   FEET: R Echymosis: no Edema: no ROM: mildly restricted at the ankle Gait: moderately antalgic with pronation and outward turned foot on R MT pain: no Callus pattern: none Lateral Mall: NT Medial Mall: NT Talus: NT Navicular: NT Cuboid: NT Calcaneous: NT Metatarsals: NT 5th  MT: NT Phalanges: NT Achilles: NT Plantar Fascia: NT Fat Pad: NT Peroneals: NT Post Tib: NOTABLY TENDER TO PALPATION Great Toe: mild restriction Ant Drawer: neg ATFL: NT CFL: NT Deltoid: NT Other foot breakdown: none Long arch: COLLAPSE Transverse arch: MODERATE BREAKDOWN Hindfoot breakdown: none Sensation: intact   Radiology: No results found.  Assessment and Plan:   Posterior tibial tendonitis of right leg - Plan: Ambulatory referral to Physical Therapy  1St MTP arthritis  Morbid obesity  >25 minutes spent in face to face time with patient, >50% spent in counselling or coordination of care   Significant foot breakdown with BMI of 46. Placed a scaphoid pad to try to unload PT tendon. Also with mild MTP OA. ASO ankle brace when on her feet.  Challenging case. Reviewed footwear and basic rehab. I do not think she understood me much and was not very enthusiastic about home rehab, so my strong recommendation was for her to go to formal PT to work on lower leg stability and PT rehab.  Prognosis: fair to poor  Follow-up: 2 months  Orders Placed This Encounter  Procedures  . Ambulatory referral to Physical Therapy    Signed,  Karleen HampshireSpencer T. Melvin Whiteford, MD   Patient's Medications  New Prescriptions   No medications on file  Previous Medications   BISOPROLOL-HYDROCHLOROTHIAZIDE (ZIAC) 5-6.25 MG PER TABLET    Take 1 tablet by mouth daily.   DICLOFENAC (VOLTAREN) 75 MG EC TABLET    Take 1 tablet (75 mg total) by mouth 2 (two) times daily.  Modified Medications   No medications on file  Discontinued Medications   GUAIFENESIN-CODEINE (ROBITUSSIN AC) 100-10 MG/5ML SYRUP    Take 5-10 mLs by mouth at bedtime as needed for cough.

## 2013-12-31 ENCOUNTER — Ambulatory Visit: Payer: Self-pay | Admitting: Specialist

## 2014-01-27 ENCOUNTER — Ambulatory Visit: Payer: BC Managed Care – PPO | Admitting: Family Medicine

## 2014-02-24 ENCOUNTER — Other Ambulatory Visit: Payer: Self-pay | Admitting: Family Medicine

## 2014-03-22 ENCOUNTER — Other Ambulatory Visit: Payer: Self-pay | Admitting: Podiatrist

## 2014-03-28 ENCOUNTER — Encounter: Payer: Self-pay | Admitting: Podiatry

## 2014-03-28 ENCOUNTER — Ambulatory Visit (INDEPENDENT_AMBULATORY_CARE_PROVIDER_SITE_OTHER): Payer: BC Managed Care – PPO | Admitting: Podiatry

## 2014-03-28 ENCOUNTER — Ambulatory Visit (INDEPENDENT_AMBULATORY_CARE_PROVIDER_SITE_OTHER): Payer: BC Managed Care – PPO

## 2014-03-28 VITALS — BP 142/81 | HR 72 | Resp 12

## 2014-03-28 DIAGNOSIS — M1A0710 Idiopathic chronic gout, right ankle and foot, without tophus (tophi): Secondary | ICD-10-CM

## 2014-03-28 DIAGNOSIS — R52 Pain, unspecified: Secondary | ICD-10-CM

## 2014-03-28 MED ORDER — COLCHICINE 0.6 MG PO TABS: ORAL_TABLET | ORAL | Status: DC

## 2014-03-28 NOTE — Progress Notes (Signed)
   Subjective:    Patient ID: Olivia Moreno, female    DOB: 02/16/1966, 48 y.o.   MRN: [id number]  HPI PT STATED RT LATERAL SIDE OF THE FOOT IS SWOLLEN AND LITTLE SORE ABOUT 1 WEEK. THE FOOT GETTING BETTER BUT WHEN WALKING IS PAINFUL. DR?. AT [facility] SEND PHYSICAL THERAPY FOR 3 SESSION AND  FOOT BRACE IT HELP SOME.  Review of Systems  HENT: Positive for sinus pressure, sneezing and sore throat.   Eyes: Positive for redness and itching.  Respiratory: Positive for cough.   Cardiovascular: Positive for leg swelling.  Allergic/Immunologic: Positive for environmental allergies.  All other systems reviewed and are negative.      Objective:   Physical Exam: I have reviewed her past medical history medications allergies surgeries social history and review of systems. Pulses are strongly palpable bilateral. Neurologic sensorium is intact percent was the monofilament. Deep tendon reflexes intact bilateral and muscle strength is 5 over 5 dorsiflexion plantar flexors and inverters and everters onto the musculature is intact. Orthopedic evaluation of his returns all joints distal to the ankle for range of motion without crepitation. She has mild tenderness on palpation overlying the fourth fifth met cuboid articulation area with mild warmth on palpation. There is no erythema edema cellulitis drainage or odor. Radiographic evaluation does not demonstrate any type of osseus abnormalities in this area. Cutaneous evaluation demonstrates supple well-hydrated cutis mild edema in the area in question. Otherwise no other cutaneous findings.       Assessment & Plan:   assessment: Gouty capsulitis right foot.  Plan: Offered her an injection today which she declined. Started her on colchicine one by mouth daily should she have another relapse of gout she will notify us immediately so we can have blood work performed. 

## 2014-05-23 ENCOUNTER — Other Ambulatory Visit: Payer: Self-pay | Admitting: Family Medicine

## 2014-11-22 ENCOUNTER — Other Ambulatory Visit: Payer: Self-pay | Admitting: Family Medicine

## 2014-12-27 ENCOUNTER — Other Ambulatory Visit: Payer: Self-pay | Admitting: Family Medicine

## 2014-12-27 NOTE — Telephone Encounter (Signed)
Spoke to pt and informed her OV is required; last seen 10/2013. 30 day supply sent to pharmacy

## 2015-01-11 ENCOUNTER — Ambulatory Visit (INDEPENDENT_AMBULATORY_CARE_PROVIDER_SITE_OTHER): Payer: BC Managed Care – PPO | Admitting: Family Medicine

## 2015-01-11 ENCOUNTER — Encounter: Payer: Self-pay | Admitting: Family Medicine

## 2015-01-11 VITALS — BP 132/88 | HR 79 | Temp 97.7°F | Wt 266.5 lb

## 2015-01-11 DIAGNOSIS — I10 Essential (primary) hypertension: Secondary | ICD-10-CM

## 2015-01-11 DIAGNOSIS — Z23 Encounter for immunization: Secondary | ICD-10-CM | POA: Diagnosis not present

## 2015-01-11 DIAGNOSIS — E785 Hyperlipidemia, unspecified: Secondary | ICD-10-CM

## 2015-01-11 DIAGNOSIS — M10071 Idiopathic gout, right ankle and foot: Secondary | ICD-10-CM | POA: Diagnosis not present

## 2015-01-11 LAB — LIPID PANEL
CHOL/HDL RATIO: 5
CHOLESTEROL: 234 mg/dL — AB (ref 0–200)
HDL: 45.1 mg/dL (ref >39.00)
NonHDL: 189.25
TRIGLYCERIDES: 218 mg/dL — AB (ref 0.0–149.0)
VLDL: 43.6 mg/dL — AB (ref 0.0–40.0)

## 2015-01-11 LAB — COMPREHENSIVE METABOLIC PANEL
ALBUMIN: 4.6 g/dL (ref 3.5–5.2)
ALK PHOS: 88 U/L (ref 39–117)
ALT: 48 U/L — ABNORMAL HIGH (ref 0–35)
AST: 47 U/L — ABNORMAL HIGH (ref 0–37)
BUN: 17 mg/dL (ref 6–23)
CALCIUM: 10.1 mg/dL (ref 8.4–10.5)
CHLORIDE: 105 meq/L (ref 96–112)
CO2: 28 mEq/L (ref 19–32)
CREATININE: 0.88 mg/dL (ref 0.40–1.20)
GFR: 72.23 mL/min (ref >60.00)
Glucose, Bld: 116 mg/dL — ABNORMAL HIGH (ref 70–99)
POTASSIUM: 4.5 meq/L (ref 3.5–5.1)
Sodium: 142 mEq/L (ref 135–145)
TOTAL PROTEIN: 7.8 g/dL (ref 6.0–8.3)
Total Bilirubin: 0.5 mg/dL (ref 0.2–1.2)

## 2015-01-11 LAB — LDL CHOLESTEROL, DIRECT: Direct LDL: 178 mg/dL

## 2015-01-11 LAB — URIC ACID: Uric Acid, Serum: 9.5 mg/dL — ABNORMAL HIGH (ref 2.4–7.0)

## 2015-01-11 MED ORDER — BISOPROLOL-HYDROCHLOROTHIAZIDE 5-6.25 MG PO TABS: 1.0000 | ORAL_TABLET | Freq: Every day | ORAL | Status: DC

## 2015-01-11 MED ORDER — DICLOFENAC SODIUM 75 MG PO TBEC: 75.0000 mg | DELAYED_RELEASE_TABLET | Freq: Two times a day (BID) | ORAL | Status: DC

## 2015-01-11 NOTE — Assessment & Plan Note (Signed)
Explained to pt that I will not prescribe phentermine for her due to her h/o HTN. She does want referral to bariatric surgery. Referral placed.

## 2015-01-11 NOTE — Progress Notes (Signed)
Pre visit review using our clinic review tool, if applicable. No additional management support is needed unless otherwise documented below in the visit note. 

## 2015-01-11 NOTE — Patient Instructions (Signed)
Great to see you. We will call you with an appointment to see a surgeon and with your lab results.  Happy thanksgiving!

## 2015-01-11 NOTE — Assessment & Plan Note (Signed)
Recheck labs today. 

## 2015-01-11 NOTE — Assessment & Plan Note (Signed)
Has not had a flare in over a year. Continue daily colchicine- followed by rheum

## 2015-01-11 NOTE — Assessment & Plan Note (Signed)
Well controlled on current rxs. No changes made today. 

## 2015-01-11 NOTE — Progress Notes (Signed)
49 yo here for follow up.   HTN- has been on Bisoprolol/HCTZ 5/6.25 mg daily for years. NO CP, SOB, LE edema. No visual changes.    Lab Results  Component Value Date   CREATININE 1.0 11/08/2013    OSA with CPAP- needs a new machine.     Gout- taking colchicine 0.6 mg daily.  Has not had a flare in over a year.  Obesity- asking for weight loss rx.  Says she has tried everything to lose weight- "every diet there is." Not exercising.  Patient Active Problem List   Diagnosis Date Noted  . Hyperglycemia 11/15/2013  . OSA on CPAP 11/15/2013  . Morbid obesity (HCC) 11/15/2013  . Gout 08/13/2013  . Left leg swelling 01/19/2013  . Peripheral edema 08/12/2012  . Hypertension   . Hyperlipidemia    Past Medical History  Diagnosis Date  . Hypertension   . Hyperlipidemia    No past surgical history on file. Social History  Substance Use Topics  . Smoking status: Never Smoker   . Smokeless tobacco: Never Used  . Alcohol Use: No   Family History  Problem Relation Age of Onset  . Pulmonary embolism Father   . Pulmonary embolism Brother    Allergies  Allergen Reactions  . Penicillins Rash    Reaction as a child   Current Outpatient Prescriptions on File Prior to Visit  Medication Sig Dispense Refill  . cetirizine (ZYRTEC) 10 MG tablet Take by mouth.    . colchicine 0.6 MG tablet Take one tablet by mouth every day. 90 tablet 3   No current facility-administered medications on file prior to visit.     The PMH, PSH, Social History, Family History, Medications, and allergies have been reviewed in [facility], and have been updated if relevant.  Review of Systems  Constitutional: Negative.   Eyes: Negative.   Respiratory: Negative.   Cardiovascular: Negative.   Musculoskeletal: Negative.   Hematological: Negative.   Psychiatric/Behavioral: Negative.   All other systems reviewed and are negative.  Marland Kitchen.  Physical exam: BP 132/88 mmHg  Pulse 79  Temp(Src) 97.7 F (36.5 C)  (Oral)  Wt 266 lb 8 oz (120.884 kg)  SpO2 95%  LMP 05/20/2010  General:  Obese, Well-developed,well-nourished,in no acute distress; alert,appropriate and cooperative throughout examination Head:  normocephalic and atraumatic.   Eyes:  vision grossly intact, pupils equal, pupils round, and pupils reactive to light.   Ears:  R ear normal and L ear normal.   Nose:  no external deformity.   Mouth:  good dentition.   Neck:  No deformities, masses, or tenderness noted.   Lungs:  Normal respiratory effort, chest expands symmetrically. Lungs are clear to auscultation, no crackles or wheezes. Heart:  Normal rate and regular rhythm. S1 and S2 normal without gallop, murmur, click, rub or other extra sounds. Abdomen:  Bowel sounds positive,abdomen soft and non-tender without masses, organomegaly or hernias noted. Msk:  No deformity or scoliosis noted of thoracic or lumbar spine.   Extremities:  No clubbing, cyanosis, edema, or deformity noted with normal full range of motion of all joints.   Neurologic:  alert & oriented X3 and gait normal.   Skin:  Intact without suspicious lesions or rashes Cervical Nodes:  No lymphadenopathy noted Axillary Nodes:  No palpable lymphadenopathy Psych:  Cognition and judgment appear intact. Alert and cooperative with normal attention span and concentration. No apparent delusions, illusions, hallucinations

## 2015-01-17 ENCOUNTER — Other Ambulatory Visit: Payer: Self-pay | Admitting: Family Medicine

## 2015-01-17 MED ORDER — SIMVASTATIN 10 MG PO TABS: 10.0000 mg | ORAL_TABLET | Freq: Every day | ORAL | Status: DC

## 2015-04-06 ENCOUNTER — Ambulatory Visit (INDEPENDENT_AMBULATORY_CARE_PROVIDER_SITE_OTHER): Payer: BC Managed Care – PPO | Admitting: Family Medicine

## 2015-04-06 ENCOUNTER — Encounter: Payer: Self-pay | Admitting: Family Medicine

## 2015-04-06 VITALS — BP 114/82 | HR 66 | Temp 98.4°F | Wt 264.5 lb

## 2015-04-06 DIAGNOSIS — J069 Acute upper respiratory infection, unspecified: Secondary | ICD-10-CM | POA: Insufficient documentation

## 2015-04-06 MED ORDER — AZITHROMYCIN 250 MG PO TABS: ORAL_TABLET | ORAL | Status: DC

## 2015-04-06 MED ORDER — HYDROCOD POLST-CPM POLST ER 10-8 MG/5ML PO SUER: 5.0000 mL | Freq: Two times a day (BID) | ORAL | Status: DC | PRN

## 2015-04-06 NOTE — Progress Notes (Signed)
SUBJECTIVE:  Olivia Moreno is a 50 y.o. female who complains of coryza, congestion and productive cough for 8 days. She denies a history of anorexia and chest pain and denies a history of asthma. Patient denies smoke cigarettes.   PCN allergic.  Taking Claritin D and Nyquil.  Current Outpatient Prescriptions on File Prior to Visit  Medication Sig Dispense Refill  . bisoprolol-hydrochlorothiazide (ZIAC) 5-6.25 MG tablet Take 1 tablet by mouth daily. 90 tablet 3  . cetirizine (ZYRTEC) 10 MG tablet Take by mouth.    . colchicine 0.6 MG tablet Take one tablet by mouth every day. 90 tablet 3  . diclofenac (VOLTAREN) 75 MG EC tablet Take 1 tablet (75 mg total) by mouth 2 (two) times daily. 180 tablet 1  . simvastatin (ZOCOR) 10 MG tablet Take 1 tablet (10 mg total) by mouth at bedtime. 90 tablet 3   No current facility-administered medications on file prior to visit.    Allergies  Allergen Reactions  . Penicillins Rash    Reaction as a child    Past Medical History  Diagnosis Date  . Hypertension   . Hyperlipidemia     No past surgical history on file.  Family History  Problem Relation Age of Onset  . Pulmonary embolism Father   . Pulmonary embolism Brother     Social History   Social History  . Marital Status: Married    Spouse Name: N/A  . Number of Children: 1  . Years of Education: N/A   Occupational History  . unemployed    Social History Main Topics  . Smoking status: Never Smoker   . Smokeless tobacco: Never Used  . Alcohol Use: No  . Drug Use: No  . Sexual Activity: Not on file   Other Topics Concern  . Not on file   Social History Narrative   The PMH, PSH, Social History, Family History, Medications, and allergies have been reviewed in [facility], and have been updated if relevant.  OBJECTIVE: BP 114/82 mmHg  Pulse 66  Temp(Src) 98.4 F (36.9 C) (Oral)  Wt 264 lb 8 oz (119.976 kg)  SpO2 90%  LMP 05/20/2010  She appears well, vital signs are as  noted. Ears normal.  Throat and pharynx normal.  Neck supple. No adenopathy in the neck. Nose is congested. Sinuses tender. The chest is clear, without wheezes or rales.  ASSESSMENT:  sinusitis  PLAN: Given duration and progression of symptoms, will treat for bacterial sinusitis with zpack (PCN allergic).  Symptomatic therapy suggested: push fluids, rest and return office visit prn if symptoms persist or worsen. . Call or return to clinic prn if these symptoms worsen or fail to improve as anticipated.

## 2015-04-06 NOTE — Patient Instructions (Signed)
Take antibiotic as directed.  Drink lots of fluids.    Treat sympotmatically with Mucinex, nasal saline irrigation, and Tylenol/Ibuprofen.   Try over the counter nasocort-start with 2 sprays per nostril per day...and then try to taper to 1 spray per nostril once symptoms improve.   You can use warm compresses.  Cough suppressant at night.   Call if not improving as expected in 5-7 days.    

## 2015-04-06 NOTE — Progress Notes (Signed)
Pre visit review using our clinic review tool, if applicable. No additional management support is needed unless otherwise documented below in the visit note. 

## 2015-05-09 ENCOUNTER — Telehealth: Payer: Self-pay | Admitting: *Deleted

## 2015-05-09 NOTE — Telephone Encounter (Signed)
[facility] faxed request for Colc[facility].  Refill denied and return fax note for pt to make an appt.  Left message for pt to make an appt.

## 2015-05-15 ENCOUNTER — Encounter: Payer: Self-pay | Admitting: Podiatry

## 2015-05-15 ENCOUNTER — Ambulatory Visit (INDEPENDENT_AMBULATORY_CARE_PROVIDER_SITE_OTHER): Payer: BC Managed Care – PPO | Admitting: Podiatry

## 2015-05-15 VITALS — BP 133/84 | HR 77 | Resp 12

## 2015-05-15 DIAGNOSIS — M1A0710 Idiopathic chronic gout, right ankle and foot, without tophus (tophi): Secondary | ICD-10-CM

## 2015-05-15 DIAGNOSIS — B353 Tinea pedis: Secondary | ICD-10-CM

## 2015-05-15 MED ORDER — COLCHICINE 0.6 MG PO TABS: ORAL_TABLET | ORAL | Status: DC

## 2015-05-15 MED ORDER — NAFTIFINE HCL 2 % EX GEL: CUTANEOUS | Status: DC

## 2015-05-15 NOTE — Progress Notes (Signed)
   Subjective:    Patient ID: Olivia Moreno, female    DOB: 06/26/1965, 50 y.o.   MRN: [id number]  HPI: She presents today to complaint of an itchy rash between her toes plantar and dorsal aspect of the bilateral foot for the past several weeks. Has tried Lotrimin which only took away the period she also states that she needs a refill on her colchicine. She denies any gout outbreaks. She is a renal patient which is a concern for allopurinol.    Review of Systems  Skin: Positive for color change.       Objective:   Physical Exam: Vital signs stable alert and oriented 3. Pulses are palpable. No Pain no open lesions or wounds. She does have interdigital tinea pedis with patches of yeast and fungus dorsal and plantar aspect.        Assessment & Plan:  Assessment tinea pedis history of gout.  Plan: Refill her colchicine for a year. Started her on Naftin gel 2%. Follow-up with me should this not resolve.

## 2015-07-11 ENCOUNTER — Ambulatory Visit (INDEPENDENT_AMBULATORY_CARE_PROVIDER_SITE_OTHER): Payer: BC Managed Care – PPO | Admitting: Family Medicine

## 2015-07-11 ENCOUNTER — Encounter: Payer: Self-pay | Admitting: Family Medicine

## 2015-07-11 VITALS — BP 132/74 | HR 62 | Temp 98.0°F | Wt 268.5 lb

## 2015-07-11 DIAGNOSIS — J069 Acute upper respiratory infection, unspecified: Secondary | ICD-10-CM | POA: Diagnosis not present

## 2015-07-11 MED ORDER — HYDROCODONE-HOMATROPINE 5-1.5 MG/5ML PO SYRP: 5.0000 mL | ORAL_SOLUTION | Freq: Three times a day (TID) | ORAL | Status: DC | PRN

## 2015-07-11 MED ORDER — AZITHROMYCIN 250 MG PO TABS: ORAL_TABLET | ORAL | Status: DC

## 2015-07-11 NOTE — Progress Notes (Signed)
SUBJECTIVE:  Olivia Moreno is a 50 y.o. female who complains of coryza, congestion, bilateral sinus pain and ear pain for 8 days. She denies a history of anorexia and chest pain and denies a history of asthma. Patient denies smoke cigarettes.   PCN allergic.  Current Outpatient Prescriptions on File Prior to Visit  Medication Sig Dispense Refill  . bisoprolol-hydrochlorothiazide (ZIAC) 5-6.25 MG tablet Take 1 tablet by mouth daily. 90 tablet 3  . cetirizine (ZYRTEC) 10 MG tablet Take by mouth.    . colchicine 0.6 MG tablet Take one tablet by mouth every day. 90 tablet 3  . diclofenac (VOLTAREN) 75 MG EC tablet Take 1 tablet (75 mg total) by mouth 2 (two) times daily. 180 tablet 1  . Naftifine HCl (NAFTIN) 2 % GEL Apply to affected area twice daily. 60 g 3  . simvastatin (ZOCOR) 10 MG tablet Take 1 tablet (10 mg total) by mouth at bedtime. 90 tablet 3   No current facility-administered medications on file prior to visit.    Allergies  Allergen Reactions  . Penicillins Rash    Reaction as a child    Past Medical History  Diagnosis Date  . Hypertension   . Hyperlipidemia     No past surgical history on file.  Family History  Problem Relation Age of Onset  . Pulmonary embolism Father   . Pulmonary embolism Brother     Social History   Social History  . Marital Status: Married    Spouse Name: N/A  . Number of Children: 1  . Years of Education: N/A   Occupational History  . unemployed    Social History Main Topics  . Smoking status: Never Smoker   . Smokeless tobacco: Never Used  . Alcohol Use: No  . Drug Use: No  . Sexual Activity: Not on file   Other Topics Concern  . Not on file   Social History Narrative   The PMH, PSH, Social History, Family History, Medications, and allergies [facility], and have been updated if relevant.  OBJECTIVE: BP 132/74 mmHg  Pulse 62  Temp(Src) 98 F (36.7 C) (Oral)  Wt 268 lb 8 oz (121.791 kg)  SpO2 97%   LMP 05/20/2010  She appears well, vital signs are as noted. Left TM bulding, Throat and pharynx normal.  Neck supple. No adenopathy in the neck. Nose is congested. Sinuses  tender. The chest is clear, without wheezes or rales.  ASSESSMENT:  otitis media and sinusitis  PLAN: zpack , Symptomatic therapy suggested: push fluids, rest and return office visit prn if symptoms persist or worsen.  Call or return to clinic prn if these symptoms worsen or fail to improve as anticipated.

## 2015-07-11 NOTE — Progress Notes (Signed)
Pre visit review using our clinic review tool, if applicable. No additional management support is needed unless otherwise documented below in the visit note. 

## 2016-01-18 ENCOUNTER — Other Ambulatory Visit: Payer: Self-pay | Admitting: Family Medicine

## 2016-02-19 ENCOUNTER — Other Ambulatory Visit: Payer: Self-pay | Admitting: Family Medicine

## 2016-03-04 ENCOUNTER — Ambulatory Visit (INDEPENDENT_AMBULATORY_CARE_PROVIDER_SITE_OTHER): Payer: BC Managed Care – PPO | Admitting: Family Medicine

## 2016-03-04 ENCOUNTER — Encounter: Payer: Self-pay | Admitting: Family Medicine

## 2016-03-04 VITALS — BP 118/84 | HR 83 | Temp 98.6°F | Ht 62.75 in | Wt 266.5 lb

## 2016-03-04 DIAGNOSIS — I10 Essential (primary) hypertension: Secondary | ICD-10-CM

## 2016-03-04 DIAGNOSIS — Z9989 Dependence on other enabling machines and devices: Secondary | ICD-10-CM

## 2016-03-04 DIAGNOSIS — G4733 Obstructive sleep apnea (adult) (pediatric): Secondary | ICD-10-CM

## 2016-03-04 DIAGNOSIS — E7800 Pure hypercholesterolemia, unspecified: Secondary | ICD-10-CM | POA: Diagnosis not present

## 2016-03-04 DIAGNOSIS — Z0000 Encounter for general adult medical examination without abnormal findings: Secondary | ICD-10-CM | POA: Insufficient documentation

## 2016-03-04 DIAGNOSIS — Z1211 Encounter for screening for malignant neoplasm of colon: Secondary | ICD-10-CM

## 2016-03-04 DIAGNOSIS — Z01419 Encounter for gynecological examination (general) (routine) without abnormal findings: Secondary | ICD-10-CM | POA: Insufficient documentation

## 2016-03-04 LAB — CBC WITH DIFFERENTIAL/PLATELET
BASOS ABS: 0 10*3/uL (ref 0.0–0.1)
BASOS PCT: 0.4 % (ref 0.0–3.0)
EOS PCT: 1.3 % (ref 0.0–5.0)
Eosinophils Absolute: 0.1 10*3/uL (ref 0.0–0.7)
HEMATOCRIT: 43.1 % (ref 36.0–46.0)
Hemoglobin: 14.9 g/dL (ref 12.0–15.0)
LYMPHS ABS: 2 10*3/uL (ref 0.7–4.0)
LYMPHS PCT: 22.7 % (ref 12.0–46.0)
MCHC: 34.5 g/dL (ref 30.0–36.0)
MCV: 93.3 fl (ref 78.0–100.0)
MONOS PCT: 4.7 % (ref 3.0–12.0)
Monocytes Absolute: 0.4 10*3/uL (ref 0.1–1.0)
NEUTROS ABS: 6.3 10*3/uL (ref 1.4–7.7)
NEUTROS PCT: 70.9 % (ref 43.0–77.0)
PLATELETS: 200 10*3/uL (ref 150.0–400.0)
RBC: 4.63 Mil/uL (ref 3.87–5.11)
RDW: 13.9 % (ref 11.5–15.5)
WBC: 8.9 10*3/uL (ref 4.0–10.5)

## 2016-03-04 LAB — LIPID PANEL
CHOL/HDL RATIO: 4
Cholesterol: 155 mg/dL (ref 0–200)
HDL: 40.6 mg/dL (ref >39.00)
LDL CALC: 80 mg/dL (ref 0–99)
NONHDL: 114.59
Triglycerides: 173 mg/dL — ABNORMAL HIGH (ref 0.0–149.0)
VLDL: 34.6 mg/dL (ref 0.0–40.0)

## 2016-03-04 LAB — COMPREHENSIVE METABOLIC PANEL
ALT: 50 U/L — ABNORMAL HIGH (ref 0–35)
AST: 56 U/L — ABNORMAL HIGH (ref 0–37)
Albumin: 4.4 g/dL (ref 3.5–5.2)
Alkaline Phosphatase: 90 U/L (ref 39–117)
BILIRUBIN TOTAL: 0.7 mg/dL (ref 0.2–1.2)
BUN: 14 mg/dL (ref 6–23)
CALCIUM: 9.9 mg/dL (ref 8.4–10.5)
CO2: 28 meq/L (ref 19–32)
Chloride: 103 mEq/L (ref 96–112)
Creatinine, Ser: 0.93 mg/dL (ref 0.40–1.20)
GFR: 67.46 mL/min (ref >60.00)
GLUCOSE: 138 mg/dL — AB (ref 70–99)
POTASSIUM: 4.1 meq/L (ref 3.5–5.1)
Sodium: 140 mEq/L (ref 135–145)
Total Protein: 7.8 g/dL (ref 6.0–8.3)

## 2016-03-04 LAB — TSH: TSH: 3.54 u[IU]/mL (ref 0.35–4.50)

## 2016-03-04 MED ORDER — DICLOFENAC SODIUM 75 MG PO TBEC: 75.0000 mg | DELAYED_RELEASE_TABLET | Freq: Two times a day (BID) | ORAL | 1 refills | Status: DC

## 2016-03-04 NOTE — Assessment & Plan Note (Signed)
Due for labs today. 

## 2016-03-04 NOTE — Assessment & Plan Note (Signed)
Controlled on current rx. No changes made, labs today.

## 2016-03-04 NOTE — Addendum Note (Signed)
Addended by: Felix AhmadiFRANSEN, Jeremaine Maraj A on: 03/04/2016 04:01 PM   Modules accepted: Orders

## 2016-03-04 NOTE — Assessment & Plan Note (Addendum)
Reviewed preventive care protocols, scheduled due services, and updated immunizations Discussed nutrition, exercise, diet, and healthy lifestyle. Refuses colonoscopy, agrees to IFOB.  Orders Placed This Encounter  Procedures  . CBC with Differential/Platelet  . Comprehensive metabolic panel  . Lipid panel  . TSH

## 2016-03-04 NOTE — Progress Notes (Signed)
Subjective:   Patient ID: Olivia Moreno, female    DOB: 01/28/1966, 51 y.o.   MRN: [id number]  Olivia Moreno is a pleasant 51 y.o. year old female who presents to clinic today with Annual Exam  on 03/04/2016  HPI:  Well woman- Has never had a Colonoscopy Mammogram? Pap smear 11/15/13- done by me.  No h/o abnormal pap smears.   HTN- has been on Bisoprolol/HCTZ 5/6.25 mg daily for years. NO CP, SOB, LE edema. No visual changes.    Lab Results  Component Value Date   CREATININE 0.88 01/11/2015   HLD- has been taking a statin but overdue for labs. Lab Results  Component Value Date   CHOL 234 (H) 01/11/2015   HDL 45.10 01/11/2015   LDLCALC 129 (H) 03/06/2012   LDLDIRECT 178.0 01/11/2015   TRIG 218.0 (H) 01/11/2015   CHOLHDL 5 01/11/2015   Lab Results  Component Value Date   NA 142 01/11/2015   K 4.5 01/11/2015   CL 105 01/11/2015   CO2 28 01/11/2015   Lab Results  Component Value Date   WBC 8.2 11/08/2013   HGB 13.5 11/08/2013   HCT 40.8 11/08/2013   MCV 92.8 11/08/2013   PLT 230.0 11/08/2013   Lab Results  Component Value Date   TSH 2.61 11/08/2013    OSA with CPAP- last saw Dr. Meredeth IdeFleming earlier this month.   Gout- taking colchicine 0.6 mg daily.  Has not had a flare in over a year.  Current Outpatient Prescriptions on File Prior to Visit  Medication Sig Dispense Refill  . bisoprolol-hydrochlorothiazide (ZIAC) 5-6.25 MG tablet TAKE 1 TABLET BY MOUTH DAILY. OFFICE VISIT REQUIRED FOR ADDITIONAL REFILLS 30 tablet 0  . cetirizine (ZYRTEC) 10 MG tablet Take by mouth.    . colchicine 0.6 MG tablet Take one tablet by mouth every day. 90 tablet 3  . Naftifine HCl (NAFTIN) 2 % GEL Apply to affected area twice daily. 60 g 3  . simvastatin (ZOCOR) 10 MG tablet TAKE 1 TAB BY MOUTH AT BEDTIME. OFFICE VISIT W/ LABS REQ. FOR ADDITIONAL REFILLS 30 tablet 0   No current facility-administered medications on file prior to visit.     Allergies  Allergen Reactions  .  Penicillins Rash    Reaction as a child    Past Medical History:  Diagnosis Date  . Hyperlipidemia   . Hypertension     No past surgical history on file.  Family History  Problem Relation Age of Onset  . Pulmonary embolism Father   . Pulmonary embolism Brother     Social History   Social History  . Marital status: Married    Spouse name: N/A  . Number of children: 1  . Years of education: N/A   Occupational History  . unemployed Unemployed   Social History Main Topics  . Smoking status: Never Smoker  . Smokeless tobacco: Never Used  . Alcohol use No  . Drug use: No  . Sexual activity: Not on file   Other Topics Concern  . Not on file   Social History Narrative  . No narrative on file   The PMH, PSH, Social History, Family History, Medications, and allergies [facility], and have been updated if relevant.  Review of Systems  Constitutional: Negative.   HENT: Negative.   Eyes: Negative.   Respiratory: Negative.   Cardiovascular: Negative.   Gastrointestinal: Negative.   Endocrine: Negative.   Genitourinary: Negative.   Musculoskeletal: Negative.  Allergic/Immunologic: Negative.   Neurological: Negative.   Hematological: Negative.   Psychiatric/Behavioral: Negative.   All other systems reviewed and are negative.      Objective:    BP 118/84   Pulse 83   Temp 98.6 F (37 C) (Oral)   Ht 5' 2.75" (1.594 m)   Wt 266 lb 8 oz (120.9 kg)   LMP 05/20/2010   SpO2 96%   BMI 47.59 kg/m    Physical Exam   General:  Well-developed,well-nourished,in no acute distress; alert,appropriate and cooperative throughout examination Head:  normocephalic and atraumatic.   Eyes:  vision grossly intact, PERRL Ears:  R ear normal and L ear normal externally, TMs clear bilaterally Nose:  no external deformity.   Mouth:  good dentition.   Neck:  No deformities, masses, or tenderness noted. Breasts:  No mass, nodules, thickening, tenderness,  bulging, retraction, inflamation, nipple discharge or skin changes noted.   Lungs:  Normal respiratory effort, chest expands symmetrically. Lungs are clear to auscultation, no crackles or wheezes. Heart:  Normal rate and regular rhythm. S1 and S2 normal without gallop, murmur, click, rub or other extra sounds. Abdomen:  Bowel sounds positive,abdomen soft and non-tender without masses, organomegaly or hernias noted. Msk:  No deformity or scoliosis noted of thoracic or lumbar spine.   Extremities:  No clubbing, cyanosis, edema, or deformity noted with normal full range of motion of all joints.   Neurologic:  alert & oriented X3 and gait normal.   Skin:  Intact without suspicious lesions or rashes Cervical Nodes:  No lymphadenopathy noted Axillary Nodes:  No palpable lymphadenopathy Psych:  Cognition and judgment appear intact. Alert and cooperative with normal attention span and concentration. No apparent delusions, illusions, hallucinations       Assessment & Plan:   Well woman exam - Plan: CBC with Differential/Platelet, Comprehensive metabolic panel, Lipid panel, TSH  Essential hypertension  Pure hypercholesterolemia  OSA on CPAP  Morbid obesity (HCC) No Follow-up on file.

## 2016-03-04 NOTE — Patient Instructions (Signed)
Great to see you.  Please call to schedule your mammogram.  Consider a colonoscopy.

## 2016-03-18 ENCOUNTER — Other Ambulatory Visit: Payer: Self-pay | Admitting: Family Medicine

## 2016-03-20 ENCOUNTER — Ambulatory Visit (INDEPENDENT_AMBULATORY_CARE_PROVIDER_SITE_OTHER): Payer: BC Managed Care – PPO

## 2016-03-20 DIAGNOSIS — Z23 Encounter for immunization: Secondary | ICD-10-CM

## 2016-04-17 ENCOUNTER — Ambulatory Visit (INDEPENDENT_AMBULATORY_CARE_PROVIDER_SITE_OTHER): Payer: BC Managed Care – PPO | Admitting: Primary Care

## 2016-04-17 ENCOUNTER — Encounter: Payer: Self-pay | Admitting: Primary Care

## 2016-04-17 VITALS — BP 122/82 | HR 84 | Temp 99.9°F | Ht 62.75 in | Wt 262.8 lb

## 2016-04-17 DIAGNOSIS — J069 Acute upper respiratory infection, unspecified: Secondary | ICD-10-CM

## 2016-04-17 MED ORDER — HYDROCODONE-HOMATROPINE 5-1.5 MG/5ML PO SYRP: 5.0000 mL | ORAL_SOLUTION | Freq: Three times a day (TID) | ORAL | 0 refills | Status: DC | PRN

## 2016-04-17 NOTE — Progress Notes (Signed)
Pre visit review using our clinic review tool, if applicable. No additional management support is needed unless otherwise documented below in the visit note. 

## 2016-04-17 NOTE — Progress Notes (Signed)
Subjective:    Patient ID: Olivia Moreno, female    DOB: 07/11/1965, 51 y.o.   MRN: [id number]  HPI  Olivia Moreno is a 51 year old female who presents today with a chief complaint of cough. She also reports sinus pressure, fatigue, ear pain, nasal congestion. She has a low grade fever today. She's been taking Mucinex and Tylenol Sinus without much improvement. Her symptoms began 2 days ago. She was exposed to influenza from her nephew who was diagnosed several days ago. She denies body aches, nausea, vomiting.  Review of Systems  Constitutional: Positive for chills, fatigue and fever.  HENT: Positive for congestion, ear pain, sinus pressure and sore throat.   Respiratory: Positive for cough. Negative for shortness of breath.   Neurological: Positive for headaches.       Past Medical History:  Diagnosis Date  . Hyperlipidemia   . Hypertension      Social History   Social History  . Marital status: Married    Spouse name: N/A  . Number of children: 1  . Years of education: N/A   Occupational History  . unemployed Unemployed   Social History Main Topics  . Smoking status: Never Smoker  . Smokeless tobacco: Never Used  . Alcohol use No  . Drug use: No  . Sexual activity: Not on file   Other Topics Concern  . Not on file   Social History Narrative  . No narrative on file    No past surgical history on file.  Family History  Problem Relation Age of Onset  . Pulmonary embolism Father   . Pulmonary embolism Brother     Allergies  Allergen Reactions  . Penicillins Rash    Reaction as a child    Current Outpatient Prescriptions on File Prior to Visit  Medication Sig Dispense Refill  . bisoprolol-hydrochlorothiazide (ZIAC) 5-6.25 MG tablet Take 1 tablet by mouth daily. 90 tablet 2  . cetirizine (ZYRTEC) 10 MG tablet Take by mouth.    . colchicine 0.6 MG tablet Take one tablet by mouth every day. 90 tablet 3  . diclofenac (VOLTAREN) 75 MG EC tablet Take 1  tablet (75 mg total) by mouth 2 (two) times daily. 180 tablet 1  . Naftifine HCl (NAFTIN) 2 % GEL Apply to affected area twice daily. 60 g 3  . simvastatin (ZOCOR) 10 MG tablet Take 1 tablet (10 mg total) by mouth at bedtime. 90 tablet 2   No current facility-administered medications on file prior to visit.     BP 122/82   Pulse 84   Temp 99.9 F (37.7 C) (Oral)   Ht 5' 2.75" (1.594 m)   Wt 262 lb 12.8 oz (119.2 kg)   LMP 05/20/2010   SpO2 97%   BMI 46.92 kg/m    Objective:   Physical Exam  Constitutional: She appears well-nourished.  HENT:  Right Ear: Tympanic membrane and ear canal normal.  Left Ear: Tympanic membrane and ear canal normal.  Nose: Right sinus exhibits no maxillary sinus tenderness and no frontal sinus tenderness. Left sinus exhibits no maxillary sinus tenderness and no frontal sinus tenderness.  Mouth/Throat: Oropharynx is clear and moist.  Eyes: Conjunctivae are normal.  Neck: Neck supple.  Cardiovascular: Normal rate and regular rhythm.   Pulmonary/Chest: Effort normal. She has no wheezes. She has rhonchi in the right upper field and the left upper field. She has no rales.  Mild rhonchi  Lymphadenopathy:    She has no cervical  adenopathy.  Skin: Skin is warm and dry.          Assessment & Plan:  URI:  Cough, fatigue, sinus pressure, nasal congestion x 2 days. No improvement with OTC treatment. Exam today with mostly clear lungs, mild rhonchi that clears with cough. She does appear acutely ill, but stable. Suspect viral involvement, do not suspect influenza based off of presentation. Rx for Hycodan provided for cough and rest. Dicussed use of Flonase and Advil. Fluids, rest, follow up PRN.  Morrie Sheldon, NP

## 2016-04-17 NOTE — Patient Instructions (Signed)
You may take the Hycodan cough suppressant every 8 hours as needed for cough and rest. Caution this medication contains codeine and will make you feel drowsy.  Nasal Congestion/Ear Pressure: Try using Flonase (fluticasone) nasal spray. Instill 1 spray in each nostril twice daily.   Try Ibuprofen 600 mg for fevers and headaches.  Please notify me if you develop persistent fevers of 101, start coughing up green mucous, notice increased fatigue or weakness, or feel worse after 1 week of onset of symptoms.   Increase consumption of water intake and rest.  It was a pleasure meeting you!   Upper Respiratory Infection, Adult Most upper respiratory infections (URIs) are a viral infection of the air passages leading to the lungs. A URI affects the nose, throat, and upper air passages. The most common type of URI is nasopharyngitis and is typically referred to as "the common cold." URIs run their course and usually go away on their own. Most of the time, a URI does not require medical attention, but sometimes a bacterial infection in the upper airways can follow a viral infection. This is called a secondary infection. Sinus and middle ear infections are common types of secondary upper respiratory infections. Bacterial pneumonia can also complicate a URI. A URI can worsen asthma and chronic obstructive pulmonary disease (COPD). Sometimes, these complications can require emergency medical care and may be life threatening. What are the causes? Almost all URIs are caused by viruses. A virus is a type of germ and can spread from one person to another. What increases the risk? You may be at risk for a URI if:  You smoke.  You have chronic heart or lung disease.  You have a weakened defense (immune) system.  You are very young or very old.  You have nasal allergies or asthma.  You work in crowded or poorly ventilated areas.  You work in health care facilities or schools. What are the signs or  symptoms? Symptoms typically develop 2-3 days after you come in contact with a cold virus. Most viral URIs last 7-10 days. However, viral URIs from the influenza virus (flu virus) can last 14-18 days and are typically more severe. Symptoms may include:  Runny or stuffy (congested) nose.  Sneezing.  Cough.  Sore throat.  Headache.  Fatigue.  Fever.  Loss of appetite.  Pain in your forehead, behind your eyes, and over your cheekbones (sinus pain).  Muscle aches. How is this diagnosed? Your health care provider may diagnose a URI by:  Physical exam.  Tests to check that your symptoms are not due to another condition such as:  Strep throat.  Sinusitis.  Pneumonia.  Asthma. How is this treated? A URI goes away on its own with time. It cannot be cured with medicines, but medicines may be prescribed or recommended to relieve symptoms. Medicines may help:  Reduce your fever.  Reduce your cough.  Relieve nasal congestion. Follow these instructions at home:  Take medicines only as directed by your health care provider.  Gargle warm saltwater or take cough drops to comfort your throat as directed by your health care provider.  Use a warm mist humidifier or inhale steam from a shower to increase air moisture. This may make it easier to breathe.  Drink enough fluid to keep your urine clear or pale yellow.  Eat soups and other clear broths and maintain good nutrition.  Rest as needed.  Return to work when your temperature has returned to normal or as your  health care provider advises. You may need to stay home longer to avoid infecting others. You can also use a face mask and careful hand washing to prevent spread of the virus.  Increase the usage of your inhaler if you have asthma.  Do not use any tobacco products, including cigarettes, chewing tobacco, or electronic cigarettes. If you need help quitting, ask your health care provider. How is this prevented? The  best way to protect yourself from getting a cold is to practice good hygiene.  Avoid oral or hand contact with people with cold symptoms.  Wash your hands often if contact occurs. There is no clear evidence that vitamin C, vitamin E, echinacea, or exercise reduces the chance of developing a cold. However, it is always recommended to get plenty of rest, exercise, and practice good nutrition. Contact a health care provider if:  You are getting worse rather than better.  Your symptoms are not controlled by medicine.  You have chills.  You have worsening shortness of breath.  You have brown or red mucus.  You have yellow or brown nasal discharge.  You have pain in your face, especially when you bend forward.  You have a fever.  You have swollen neck glands.  You have pain while swallowing.  You have white areas in the back of your throat. Get help right away if:  You have severe or persistent:  Headache.  Ear pain.  Sinus pain.  Chest pain.  You have chronic lung disease and any of the following:  Wheezing.  Prolonged cough.  Coughing up blood.  A change in your usual mucus.  You have a stiff neck.  You have changes in your:  Vision.  Hearing.  Thinking.  Mood. This information is not intended to replace advice given to you by your health care provider. Make sure you discuss any questions you have with your health care provider. Document Released: 07/31/2000 Document Revised: 10/08/2015 Document Reviewed: 05/12/2013 Elsevier Interactive Patient Education  2017 [employer].

## 2016-05-17 ENCOUNTER — Telehealth: Payer: Self-pay | Admitting: *Deleted

## 2016-05-17 NOTE — Telephone Encounter (Signed)
Received fax request for Colcrys. Pt has not been seen in office since 04/2015, Dr. Al Corpus states pt needs an appt. Returned denied refill.

## 2016-06-24 ENCOUNTER — Ambulatory Visit (INDEPENDENT_AMBULATORY_CARE_PROVIDER_SITE_OTHER): Payer: BC Managed Care – PPO | Admitting: Podiatry

## 2016-06-24 ENCOUNTER — Encounter: Payer: Self-pay | Admitting: Podiatry

## 2016-06-24 DIAGNOSIS — M1A0710 Idiopathic chronic gout, right ankle and foot, without tophus (tophi): Secondary | ICD-10-CM | POA: Diagnosis not present

## 2016-06-24 DIAGNOSIS — L603 Nail dystrophy: Secondary | ICD-10-CM | POA: Diagnosis not present

## 2016-06-24 MED ORDER — COLCHICINE 0.6 MG PO TABS
0.6000 mg | ORAL_TABLET | Freq: Every day | ORAL | 3 refills | Status: DC
Start: 2016-06-24 — End: 2016-07-12

## 2016-06-24 NOTE — Progress Notes (Signed)
She presents today states that she has run out of colchicine and since doing so she has started to develop more aches and pains. She states that are not sure the gout is coming back but for some reason the colchicine really seems to make my joints filled better. She is also concerned about discoloration to the hallux nail right. She denies any trauma to his states that after having gone to the nail salon and has become thick and discolored.  Objective: Vital signs are stable alert and oriented 3. I've reviewed her past medical history medications allergies surgeries and social history. Pulses remain palpable. 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  Assessment: History of gout. Nail dystrophy bilateral.  Plan: Debridement of nails and skin today sent for pathologic evaluation and I also refilled her cultures seen one 0.6 mg tablet daily by mouth. We will follow up with her once her pathology has returned.

## 2016-06-24 NOTE — Addendum Note (Signed)
Addended by: Lottie RaterPREVETTE, Reesha Debes E on: 06/24/2016 05:16 PM   Modules accepted: Orders

## 2016-06-27 ENCOUNTER — Telehealth: Payer: Self-pay | Admitting: *Deleted

## 2016-07-11 ENCOUNTER — Encounter: Payer: Self-pay | Admitting: Family Medicine

## 2016-07-11 ENCOUNTER — Ambulatory Visit (INDEPENDENT_AMBULATORY_CARE_PROVIDER_SITE_OTHER): Payer: BC Managed Care – PPO | Admitting: Family Medicine

## 2016-07-11 VITALS — BP 140/88 | HR 86 | Temp 98.3°F | Ht 63.0 in | Wt 257.0 lb

## 2016-07-11 DIAGNOSIS — R05 Cough: Secondary | ICD-10-CM

## 2016-07-11 DIAGNOSIS — R059 Cough, unspecified: Secondary | ICD-10-CM

## 2016-07-11 MED ORDER — HYDROCOD POLST-CPM POLST ER 10-8 MG/5ML PO SUER: 5.0000 mL | Freq: Two times a day (BID) | ORAL | 0 refills | Status: DC | PRN

## 2016-07-11 NOTE — Progress Notes (Signed)
SUBJECTIVE:  Olivia Moreno is a 51 y.o. female who complains of coryza, congestion and productive cough for 4 days. She denies a history of anorexia and chest pain and denies a history of asthma. Patient denies smoke cigarettes.   Current Outpatient Prescriptions on File Prior to Visit  Medication Sig Dispense Refill  . bisoprolol-hydrochlorothiazide (ZIAC) 5-6.25 MG tablet Take 1 tablet by mouth daily. 90 tablet 2  . cetirizine (ZYRTEC) 10 MG tablet Take by mouth.    . colchicine (COLCRYS) 0.6 MG tablet Take 1 tablet (0.6 mg total) by mouth daily. 90 tablet 3  . diclofenac (VOLTAREN) 75 MG EC tablet Take 1 tablet (75 mg total) by mouth 2 (two) times daily. 180 tablet 1  . Naftifine HCl (NAFTIN) 2 % GEL Apply to affected area twice daily. 60 g 3  . simvastatin (ZOCOR) 10 MG tablet Take 1 tablet (10 mg total) by mouth at bedtime. 90 tablet 2   No current facility-administered medications on file prior to visit.     Allergies  Allergen Reactions  . Penicillins Rash    Reaction as a child    Past Medical History:  Diagnosis Date  . Hyperlipidemia   . Hypertension     No past surgical history on file.  Family History  Problem Relation Age of Onset  . Pulmonary embolism Father   . Pulmonary embolism Brother     Social History   Social History  . Marital status: Married    Spouse name: N/A  . Number of children: 1  . Years of education: N/A   Occupational History  . unemployed Unemployed   Social History Main Topics  . Smoking status: Never Smoker  . Smokeless tobacco: Never Used  . Alcohol use No  . Drug use: No  . Sexual activity: Not on file   Other Topics Concern  . Not on file   Social History Narrative  . No narrative on file   The PMH, PSH, Social History, Family History, Medications, and allergies [facility], and have been updated if relevant.  OBJECTIVE: BP 140/88   Pulse 86   Temp 98.3 F (36.8 C)   Ht 5\' 3"  (1.6 m)   Wt 257 lb  (116.6 kg)   LMP 05/20/2010   SpO2 97%   BMI 45.53 kg/m   She appears well, vital signs are as noted. Ears normal.  Throat and pharynx normal.  Neck supple. No adenopathy in the neck. Nose is congested. Sinuses non tender. The chest is clear, without wheezes or rales.  ASSESSMENT:  viral upper respiratory illness  PLAN: Symptomatic therapy suggested: push fluids, rest and return office visit prn if symptoms persist or worsen. Lack of antibiotic effectiveness discussed with her. Call or return to clinic prn if these symptoms worsen or fail to improve as anticipated. Tussionex rx printed and given to pt.

## 2016-07-12 ENCOUNTER — Telehealth: Payer: Self-pay

## 2016-07-12 ENCOUNTER — Telehealth: Payer: Self-pay | Admitting: Podiatry

## 2016-07-12 MED ORDER — COLCHICINE 0.6 MG PO TABS: 0.6000 mg | ORAL_TABLET | Freq: Every day | ORAL | 3 refills | Status: DC

## 2016-07-12 NOTE — Telephone Encounter (Signed)
Left message for patient to call office back.

## 2016-07-12 NOTE — Telephone Encounter (Signed)
Pt called wanting to know if someone called her about her report. And that [facility] [place] [facility] [place] [facility]90 so she didn't get it

## 2016-07-12 NOTE — Telephone Encounter (Signed)
I returned phone call to patient, which she states that [facility].  [facility] [place] [facility] [address]   [facility] script [facility] [place].

## 2016-09-08 ENCOUNTER — Other Ambulatory Visit: Payer: Self-pay | Admitting: Family Medicine

## 2016-09-09 NOTE — Telephone Encounter (Signed)
Last refill 03/04/16 Last OV 07/11/16 Ok to refill?

## 2016-11-29 NOTE — Telephone Encounter (Signed)
Entered in error

## 2017-01-01 ENCOUNTER — Other Ambulatory Visit: Payer: Self-pay | Admitting: Family Medicine

## 2017-03-20 ENCOUNTER — Other Ambulatory Visit: Payer: Self-pay

## 2017-03-20 MED ORDER — DICLOFENAC SODIUM 75 MG PO TBEC: 75.0000 mg | DELAYED_RELEASE_TABLET | Freq: Two times a day (BID) | ORAL | 0 refills | Status: DC

## 2017-04-04 ENCOUNTER — Other Ambulatory Visit: Payer: Self-pay | Admitting: Family Medicine

## 2017-04-04 NOTE — Telephone Encounter (Signed)
Advised last fill Nov to sched appt/must sched first/thx dmf

## 2017-04-07 ENCOUNTER — Telehealth: Payer: Self-pay | Admitting: Primary Care

## 2017-04-07 MED ORDER — BISOPROLOL-HYDROCHLOROTHIAZIDE 5-6.25 MG PO TABS: 1.0000 | ORAL_TABLET | Freq: Every day | ORAL | 0 refills | Status: DC

## 2017-04-07 NOTE — Telephone Encounter (Signed)
Attempted to contact pt regarding refill request; left message on voice mail, [phone]  that 30 day refill [facility] to [facility].

## 2017-04-07 NOTE — Telephone Encounter (Signed)
Copied from CRM [id number]. Topic: Quick Communication - See Telephone Encounter >> Apr 07, 2017 10:08 AM Terisa Starraylor, Brittany L wrote: CRM for notification. See Telephone encounter for:   04/07/17.  Pt wants to know if bisoprolol-hydrochlorothiazide [facility](ZIAC) 5-6.25 MG tablet can be re-filled until her new patient appt on 3/13 with kate clark. Please advise.   [facility] [address] [place] - [phone]  [phone]

## 2017-04-30 ENCOUNTER — Encounter: Payer: Self-pay | Admitting: Primary Care

## 2017-04-30 ENCOUNTER — Ambulatory Visit: Payer: BC Managed Care – PPO | Admitting: Primary Care

## 2017-04-30 VITALS — BP 122/82 | HR 84 | Temp 98.4°F | Ht 63.0 in | Wt 242.8 lb

## 2017-04-30 DIAGNOSIS — M2540 Effusion, unspecified joint: Secondary | ICD-10-CM

## 2017-04-30 DIAGNOSIS — R252 Cramp and spasm: Secondary | ICD-10-CM

## 2017-04-30 DIAGNOSIS — E7800 Pure hypercholesterolemia, unspecified: Secondary | ICD-10-CM

## 2017-04-30 DIAGNOSIS — M79643 Pain in unspecified hand: Secondary | ICD-10-CM

## 2017-04-30 DIAGNOSIS — M10071 Idiopathic gout, right ankle and foot: Secondary | ICD-10-CM | POA: Diagnosis not present

## 2017-04-30 DIAGNOSIS — G8929 Other chronic pain: Secondary | ICD-10-CM | POA: Insufficient documentation

## 2017-04-30 DIAGNOSIS — I10 Essential (primary) hypertension: Secondary | ICD-10-CM

## 2017-04-30 DIAGNOSIS — R7303 Prediabetes: Secondary | ICD-10-CM

## 2017-04-30 MED ORDER — BISOPROLOL-HYDROCHLOROTHIAZIDE 5-6.25 MG PO TABS: 1.0000 | ORAL_TABLET | Freq: Every day | ORAL | 3 refills | Status: DC

## 2017-04-30 NOTE — Assessment & Plan Note (Signed)
Also with arthralgias. Symptoms to DIP joints of first and third digits of right hand.  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 Check Sed rate and RF.   Discussed using braces for carpal tunnel symptoms.

## 2017-04-30 NOTE — Assessment & Plan Note (Signed)
Located to bilateral lower extremities for years.  Could be secondary to to statin, although she's taking this every 2 days.  Check CMP, A1C today. Consider statin holiday x 2 weeks, then consider Crestor.

## 2017-04-30 NOTE — Assessment & Plan Note (Signed)
Last A1C of 6.2 in 2015. Repeat A1C pending.

## 2017-04-30 NOTE — Progress Notes (Signed)
Subjective:    Patient ID: Olivia Moreno, female    DOB: 04/14/1965, 52 y.o.   MRN: [id number]  HPI  Ms. Olivia Moreno is a 52 year old female who presents today to transfer care from Dr. Dayton MartesAron. Her last physical was over one year.   1) Essential Hypertension: Currently managed on bisoprolol-HCTZ 5-6.25 mg. She denies chest pain, dizziness, headaches. She has an intermittent cough that is not bothersome.   BP Readings from Last 3 Encounters:  04/30/17 122/82  07/11/16 140/88  04/17/16 122/82     2) Hyperlipidemia: Currently managed on simvastatin 10 mg for which she takes every 2 days on average due to lower extremity cramping. Her last lipid panel was over one year ago with LDL of 80.   3) Gout: Intermittent. Currently managed on colchicine 0.6 mg PRN which was prescribed by podiatry. Her last gout flare was over one year ago since she stopped eating pork.   4) Chronic Hand Pain: Also with joint swelling to the DIP joint of her right 1st and 3rd digit which has been present daily for the last 2 years. She denies stiffness. She works on a Animatorcomputer for most of the day. She does experience some numbness to her left lateral wrist, sometimes wears braces.   5) Bilateral Lower Extremity Cramping: Located to bilateral ankles calves. Present for several years. Is currently taking Simvastatin 10 mg every two days and hasn't noticed much improvement. She did notice improvements after taking a multi-vitamin and orange juice.   Review of Systems  Eyes: Negative for visual disturbance.  Respiratory: Negative for shortness of breath.   Cardiovascular: Negative for chest pain.  Musculoskeletal: Positive for arthralgias.       Bilateral lower extremity cramping  Neurological: Negative for dizziness and headaches.       Past Medical History:  Diagnosis Date  . Gout   . Hyperlipidemia   . Hypertension      Social History   Socioeconomic History  . Marital status: Married    Spouse name:  Not on file  . Number of children: 1  . Years of education: Not on file  . Highest education level: Not on file  Social Needs  . Financial resource strain: Not on file  . Food insecurity - worry: Not on file  . Food insecurity - inability: Not on file  . Transportation needs - medical: Not on file  . Transportation needs - non-medical: Not on file  Occupational History  . Occupation: unemployed    Associate Professormployer: UNEMPLOYED  Tobacco Use  . Smoking status: Never Smoker  . Smokeless tobacco: Never Used  Substance and Sexual Activity  . Alcohol use: No  . Drug use: No  . Sexual activity: Not on file  Other Topics Concern  . Not on file  Social History Narrative   Married.   1 child, 1 grandson.   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     No past surgical history on file.  Family History  Problem Relation Age of Onset  . Heart attack Mother   . Pulmonary embolism Father   . Pulmonary embolism Brother     Allergies  Allergen Reactions  . Penicillins Rash    Reaction as a child    Current Outpatient Medications on File Prior to Visit  Medication Sig Dispense Refill  . cetirizine (ZYRTEC) 10 MG tablet Take by mouth.    . colchicine (COLCRYS)  0.6 MG tablet Take 1 tablet (0.6 mg total) by mouth daily. 90 tablet 3  . diclofenac (VOLTAREN) 75 MG EC tablet Take 1 tablet (75 mg total) by mouth 2 (two) times daily. 60 tablet 0  . Naftifine HCl (NAFTIN) 2 % GEL Apply to affected area twice daily. 60 g 3  . simvastatin (ZOCOR) 10 MG tablet Take 1 tablet (10 mg total) by mouth at bedtime. 90 tablet 2   No current facility-administered medications on file prior to visit.     BP 122/82   Pulse 84   Temp 98.4 F (36.9 C) (Oral)   Ht 5\' 3"  (1.6 m)   Wt 242 lb 12 oz (110.1 kg)   LMP 05/20/2010   SpO2 93%   BMI 43.00 kg/m    Objective:   Physical Exam  Constitutional: She appears well-nourished.  Neck: Neck supple.  Cardiovascular:  Normal rate and regular rhythm.  Pulmonary/Chest: Effort normal and breath sounds normal.  Musculoskeletal:  No joint swelling to digits of hands.  Neurological:  Negative Tinel's and Phalen's sign.  Skin: Skin is warm and dry.  Psychiatric: She has a normal mood and affect.          Assessment & Plan:

## 2017-04-30 NOTE — Assessment & Plan Note (Signed)
Stable in the office today, continue Ziac. BMP pending, refills sent to pharmacy.

## 2017-04-30 NOTE — Patient Instructions (Signed)
I sent refills to your pharmacy.  Schedule a lab only appointment to return for fasting labs. No food for at least 4 hours. You may have water and black coffee.  It was a pleasure meeting you!

## 2017-04-30 NOTE — Assessment & Plan Note (Signed)
Repeat lipid panel pending. Check LFT's. Taking simvastatin every 2 days.

## 2017-04-30 NOTE — Assessment & Plan Note (Signed)
Infrequent flares since cessation from pork. Continue to monitor.

## 2017-05-08 ENCOUNTER — Other Ambulatory Visit (INDEPENDENT_AMBULATORY_CARE_PROVIDER_SITE_OTHER): Payer: BC Managed Care – PPO

## 2017-05-08 DIAGNOSIS — M2540 Effusion, unspecified joint: Secondary | ICD-10-CM

## 2017-05-08 DIAGNOSIS — R7303 Prediabetes: Secondary | ICD-10-CM | POA: Diagnosis not present

## 2017-05-08 DIAGNOSIS — E7800 Pure hypercholesterolemia, unspecified: Secondary | ICD-10-CM

## 2017-05-08 LAB — LDL CHOLESTEROL, DIRECT: LDL DIRECT: 148 mg/dL

## 2017-05-08 LAB — LIPID PANEL
Cholesterol: 263 mg/dL — ABNORMAL HIGH (ref 0–200)
HDL: 33 mg/dL — ABNORMAL LOW (ref >39.00)
Total CHOL/HDL Ratio: 8
Triglycerides: 451 mg/dL — ABNORMAL HIGH (ref 0.0–149.0)

## 2017-05-08 LAB — COMPREHENSIVE METABOLIC PANEL
ALBUMIN: 4.5 g/dL (ref 3.5–5.2)
ALK PHOS: 133 U/L — AB (ref 39–117)
ALT: 51 U/L — AB (ref 0–35)
AST: 52 U/L — AB (ref 0–37)
BILIRUBIN TOTAL: 0.6 mg/dL (ref 0.2–1.2)
BUN: 25 mg/dL — AB (ref 6–23)
CALCIUM: 10.2 mg/dL (ref 8.4–10.5)
CO2: 27 mEq/L (ref 19–32)
CREATININE: 1.08 mg/dL (ref 0.40–1.20)
Chloride: 101 mEq/L (ref 96–112)
GFR: 56.5 mL/min — ABNORMAL LOW (ref >60.00)
Glucose, Bld: 423 mg/dL — ABNORMAL HIGH (ref 70–99)
Potassium: 4.5 mEq/L (ref 3.5–5.1)
SODIUM: 138 meq/L (ref 135–145)
TOTAL PROTEIN: 8.1 g/dL (ref 6.0–8.3)

## 2017-05-08 LAB — URIC ACID: URIC ACID, SERUM: 9.1 mg/dL — AB (ref 2.4–7.0)

## 2017-05-08 LAB — SEDIMENTATION RATE: SED RATE: 22 mm/h (ref 0–30)

## 2017-05-08 LAB — HEMOGLOBIN A1C: Hgb A1c MFr Bld: 12.1 % — ABNORMAL HIGH (ref 4.6–6.5)

## 2017-05-09 ENCOUNTER — Other Ambulatory Visit: Payer: Self-pay | Admitting: Primary Care

## 2017-05-09 ENCOUNTER — Encounter: Payer: Self-pay | Admitting: Primary Care

## 2017-05-09 DIAGNOSIS — E119 Type 2 diabetes mellitus without complications: Secondary | ICD-10-CM

## 2017-05-09 DIAGNOSIS — E782 Mixed hyperlipidemia: Secondary | ICD-10-CM

## 2017-05-09 DIAGNOSIS — M79643 Pain in unspecified hand: Secondary | ICD-10-CM

## 2017-05-09 DIAGNOSIS — G8929 Other chronic pain: Secondary | ICD-10-CM

## 2017-05-09 LAB — RHEUMATOID FACTOR: RHEUMATOID FACTOR: 34 [IU]/mL — AB (ref <14)

## 2017-05-09 MED ORDER — BASAGLAR KWIKPEN 100 UNIT/ML ~~LOC~~ SOPN: 10.0000 [IU] | PEN_INJECTOR | Freq: Every day | SUBCUTANEOUS | 5 refills | Status: DC

## 2017-05-09 MED ORDER — ROSUVASTATIN CALCIUM 5 MG PO TABS: ORAL_TABLET | ORAL | 1 refills | Status: DC

## 2017-05-09 MED ORDER — METFORMIN HCL 500 MG PO TABS: 500.0000 mg | ORAL_TABLET | Freq: Two times a day (BID) | ORAL | 1 refills | Status: DC

## 2017-05-09 MED ORDER — GLUCOSE BLOOD VI STRP: ORAL_STRIP | 3 refills | Status: DC

## 2017-05-09 MED ORDER — INSULIN GLARGINE 100 UNIT/ML SOLOSTAR PEN: 10.0000 [IU] | PEN_INJECTOR | Freq: Every evening | SUBCUTANEOUS | 5 refills | Status: DC

## 2017-05-09 MED ORDER — PEN NEEDLES 31G X 5 MM MISC: 1.0000 | Freq: Every evening | 3 refills | Status: DC

## 2017-05-09 MED ORDER — PEN NEEDLES 31G X 6 MM MISC: 3 refills | Status: DC

## 2017-06-25 NOTE — Progress Notes (Deleted)
Office Visit Note  Patient: Olivia Moreno             Date of Birth: Apr 21, 1965           MRN: [id number]             PCP: Doreene Nest, NP Referring: Doreene Nest, NP Visit Date: 07/09/2017 Occupation: @    Subjective:  No chief complaint on file.   History of Present Illness: Olivia Moreno is a 52 y.o. female ***   Activities of Daily Living:  Patient reports morning stiffness for *** {minute/hour:19697}.   Patient {ACTIONS;DENIES/REPORTS:21021675::"Denies"} nocturnal pain.  Difficulty dressing/grooming: {ACTIONS;DENIES/REPORTS:21021675::"Denies"} Difficulty climbing stairs: {ACTIONS;DENIES/REPORTS:21021675::"Denies"} Difficulty getting out of chair: {ACTIONS;DENIES/REPORTS:21021675::"Denies"} Difficulty using hands for taps, buttons, cutlery, and/or writing: {ACTIONS;DENIES/REPORTS:21021675::"Denies"}   No Rheumatology ROS completed.   PMFS History:  Patient Active Problem List   Diagnosis Date Noted  . Leg cramping 04/30/2017  . Chronic hand pain 04/30/2017  . Well woman exam 03/04/2016  . Prediabetes 11/15/2013  . OSA on CPAP 11/15/2013  . Morbid obesity (HCC) 11/15/2013  . Gout 08/13/2013  . Peripheral edema 08/12/2012  . Hypertension   . Hyperlipidemia     Past Medical History:  Diagnosis Date  . Gout   . Hyperlipidemia   . Hypertension     Family History  Problem Relation Age of Onset  . Heart attack Mother   . Pulmonary embolism Father   . Pulmonary embolism Brother    No past surgical history on file. Social History   Social History Narrative   Married.   1 child, 1 grandson.   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      Objective: Vital Signs: LMP 05/20/2010    Physical Exam   Musculoskeletal Exam: ***  CDAI Exam: No CDAI exam completed.    Investigation: Findings:  05/08/17: RF 34, sed rate 22, Uric acid 9.1 03/04/16: TSH 3.54   Component     Latest Ref Rng &  Units 05/08/2017  Uric Acid, Serum     2.4 - 7.0 mg/dL 9.1 (H)  Sed Rate     0 - 30 mm/hr 22  RA Latex Turbid.     <14 IU/mL 34 (H)   CBC Latest Ref Rng & Units 03/04/2016 11/08/2013 08/12/2012  WBC 4.0 - 10.5 K/uL 8.9 8.2 9.8  Hemoglobin 12.0 - 15.0 g/dL 40.9 81.1 91.4  Hematocrit 36.0 - 46.0 % 43.1 40.8 43.5  Platelets 150.0 - 400.0 K/uL 200.0 230.0 260.0   CMP Latest Ref Rng & Units 05/08/2017 03/04/2016 01/11/2015  Glucose 70 - 99 mg/dL 782(N) 562(Z) 308(M)  BUN 6 - 23 mg/dL 57(Q) 14 17  Creatinine 0.40 - 1.20 mg/dL 4.69 6.29 5.28  Sodium 135 - 145 mEq/L 138 140 142  Potassium 3.5 - 5.1 mEq/L 4.5 4.1 4.5  Chloride 96 - 112 mEq/L 101 103 105  CO2 19 - 32 mEq/L Calcium 8.4 - 10.5 mg/dL 41.3 9.9 24.4  Total Protein 6.0 - 8.3 g/dL 8.1 7.8 7.8  Total Bilirubin 0.2 - 1.2 mg/dL 0.6 0.7 0.5  Alkaline Phos 39 - 117 U/L 133(H) 90 88  AST 0 - 37 U/L 52(H) 56(H) 47(H)  ALT 0 - 35 U/L 51(H) 50(H) 48(H)    Imaging: No results found.  Speciality Comments: No specialty comments available.    Procedures:  No procedures performed Allergies: Penicillins   Assessment / Plan:  Visit Diagnoses: Polyarthralgia    Orders: No orders of the defined types were placed in this encounter.  No orders of the defined types were placed in this encounter.   Face-to-face time spent with patient was *** minutes. 50% of time was spent in counseling and coordination of care.  Follow-Up Instructions: No follow-ups on file.   Gearldine Bienenstock, PA-C  Note - This record has been created using Dragon software.  Chart creation errors have been sought, but may not always  have been located. Such creation errors do not reflect on  the standard of medical care.

## 2017-07-09 ENCOUNTER — Ambulatory Visit: Payer: BC Managed Care – PPO | Admitting: Rheumatology

## 2017-07-10 ENCOUNTER — Other Ambulatory Visit: Payer: Self-pay | Admitting: Family Medicine

## 2017-07-10 DIAGNOSIS — G8929 Other chronic pain: Secondary | ICD-10-CM

## 2017-07-10 DIAGNOSIS — M79643 Pain in unspecified hand: Principal | ICD-10-CM

## 2017-07-10 NOTE — Telephone Encounter (Signed)
Refill sent to pharmacy.   

## 2017-07-10 NOTE — Telephone Encounter (Signed)
KC-Plz see refill req/thx dmf 

## 2017-08-04 NOTE — Progress Notes (Deleted)
   Office Visit Note  Patient: Olivia Moreno             Date of Birth: 12/25/1965           MRN: [id number]             PCP: Doreene Nestlark, Katherine K, NP Referring: Doreene Nestlark, Katherine K, NP Visit Date: 08/15/2017 Occupation: @GUAROCC @    Subjective:  No chief complaint on file.   History of Present Illness: Olivia Moreno is a 52 y.o. female ***   Activities of Daily Living:  Patient reports morning stiffness for *** {minute/hour:19697}.   Patient {ACTIONS;DENIES/REPORTS:21021675::"Denies"} nocturnal pain.  Difficulty dressing/grooming: {ACTIONS;DENIES/REPORTS:21021675::"Denies"} Difficulty climbing stairs: {ACTIONS;DENIES/REPORTS:21021675::"Denies"} Difficulty getting out of chair: {ACTIONS;DENIES/REPORTS:21021675::"Denies"} Difficulty using hands for taps, buttons, cutlery, and/or writing: {ACTIONS;DENIES/REPORTS:21021675::"Denies"}   No Rheumatology ROS completed.   PMFS History:  Patient Active Problem List   Diagnosis Date Noted  . Leg cramping 04/30/2017  . Chronic hand pain 04/30/2017  . Well woman exam 03/04/2016  . Prediabetes 11/15/2013  . OSA on CPAP 11/15/2013  . Morbid obesity (HCC) 11/15/2013  . Gout 08/13/2013  . Peripheral edema 08/12/2012  . Hypertension   . Hyperlipidemia     Past Medical History:  Diagnosis Date  . Gout   . Hyperlipidemia   . Hypertension     Family History  Problem Relation Age of Onset  . Heart attack Mother   . Pulmonary embolism Father   . Pulmonary embolism Brother    No past surgical history on file. Social History   Social History Narrative   Married.   1 child, 1 grandson.   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      Objective: Vital Signs: LMP 05/20/2010    Physical Exam   Musculoskeletal Exam: ***  CDAI Exam: No CDAI exam completed.    Investigation: No additional findings.   Imaging: No results found.  Speciality Comments: No specialty comments  available.    Procedures:  No procedures performed Allergies: Penicillins   Assessment / Plan:     Visit Diagnoses: No diagnosis found.    Orders: No orders of the defined types were placed in this encounter.  No orders of the defined types were placed in this encounter.   Face-to-face time spent with patient was *** minutes. 50% of time was spent in counseling and coordination of care.  Follow-Up Instructions: No follow-ups on file.   Pollyann SavoyShaili Ladislao Cohenour, MD  Note - This record has been created using Animal nutritionistDragon software.  Chart creation errors have been sought, but may not always  have been located. Such creation errors do not reflect on  the standard of medical care.

## 2017-08-15 ENCOUNTER — Ambulatory Visit: Payer: BC Managed Care – PPO | Admitting: Rheumatology

## 2017-09-02 ENCOUNTER — Encounter: Payer: Self-pay | Admitting: Podiatry

## 2017-09-02 ENCOUNTER — Ambulatory Visit: Payer: BC Managed Care – PPO | Admitting: Podiatry

## 2017-09-02 DIAGNOSIS — M10471 Other secondary gout, right ankle and foot: Secondary | ICD-10-CM

## 2017-09-02 DIAGNOSIS — M7751 Other enthesopathy of right foot: Secondary | ICD-10-CM

## 2017-09-02 DIAGNOSIS — M779 Enthesopathy, unspecified: Secondary | ICD-10-CM

## 2017-09-02 MED ORDER — COLCHICINE 0.6 MG PO TABS: 0.6000 mg | ORAL_TABLET | Freq: Every day | ORAL | 1 refills | Status: DC

## 2017-09-11 NOTE — Progress Notes (Signed)
   HPI: 52 year old female presenting today with a chief complaint of a gout flare up of the right foot that began two weeks ago. She states she needs a refill on the Colcrys she normally takes for her symptoms. She has not done anything for treatment. Walking and touching the area increases the pain. Patient is here for further evaluation and treatment.   Past Medical History:  Diagnosis Date  . Gout   . Hyperlipidemia   . Hypertension      Physical Exam: General: The patient is alert and oriented x3 in no acute distress.  Dermatology: Skin is warm, dry and supple bilateral lower extremities. Negative for open lesions or macerations.  Vascular: Palpable pedal pulses bilaterally. No edema or erythema noted. Capillary refill within normal limits.  Neurological: Epicritic and protective threshold grossly intact bilaterally.   Musculoskeletal Exam: Pain on palpation to the right 1st MPJ with erythema and edema.   Assessment: 1. Gout/1st MPJ capsulitis right    Plan of Care:  1. Patient evaluated.  2. Injection of 0.5 mLs Celestone Soluspan injected into the 1st MPJ of the right foot.  3. Prescription for Colcrys 0.6 mg provided to patient.  4. Recommended controlling uric acid levels via diet.  5. Return to clinic as needed.       Felecia ShellingBrent M. Delicia Berens, DPM [facility]  Dr. Felecia ShellingBrent M. Brynli Ollis, DPM    [address]                                        [place], [place] [id number]                Office [phone]  Fax [phone]

## 2017-09-15 ENCOUNTER — Other Ambulatory Visit: Payer: Self-pay | Admitting: Primary Care

## 2017-09-15 DIAGNOSIS — M79643 Pain in unspecified hand: Principal | ICD-10-CM

## 2017-09-15 DIAGNOSIS — G8929 Other chronic pain: Secondary | ICD-10-CM

## 2017-10-22 ENCOUNTER — Other Ambulatory Visit: Payer: Self-pay | Admitting: Primary Care

## 2017-10-22 DIAGNOSIS — E782 Mixed hyperlipidemia: Secondary | ICD-10-CM

## 2017-10-23 NOTE — Telephone Encounter (Signed)
Last prescribed on 05/09/2017 Last office visit on 04/30/2017

## 2017-10-24 NOTE — Telephone Encounter (Signed)
My chart message was sent to patient for further information regarding Crestor.  Will also repeat lipids.

## 2017-11-08 ENCOUNTER — Other Ambulatory Visit: Payer: Self-pay | Admitting: Primary Care

## 2017-11-08 DIAGNOSIS — E119 Type 2 diabetes mellitus without complications: Secondary | ICD-10-CM

## 2017-11-16 ENCOUNTER — Other Ambulatory Visit: Payer: Self-pay | Admitting: Family Medicine

## 2017-11-16 DIAGNOSIS — G8929 Other chronic pain: Secondary | ICD-10-CM

## 2017-11-16 DIAGNOSIS — M79643 Pain in unspecified hand: Principal | ICD-10-CM

## 2017-11-17 NOTE — Telephone Encounter (Signed)
Noted, refill sent to pharmacy. 

## 2017-11-17 NOTE — Telephone Encounter (Signed)
Please see rx refill request  °

## 2017-11-19 ENCOUNTER — Other Ambulatory Visit: Payer: Self-pay | Admitting: Family Medicine

## 2017-11-19 NOTE — Telephone Encounter (Signed)
KC-Plz see refill req/thx dmf 

## 2017-11-19 NOTE — Telephone Encounter (Signed)
Duplicate. Already refilled on 09/30.

## 2017-11-24 ENCOUNTER — Other Ambulatory Visit: Payer: Self-pay | Admitting: Primary Care

## 2017-11-24 DIAGNOSIS — Z1231 Encounter for screening mammogram for malignant neoplasm of breast: Secondary | ICD-10-CM

## 2017-11-26 ENCOUNTER — Ambulatory Visit
Admission: RE | Admit: 2017-11-26 | Discharge: 2017-11-26 | Disposition: A | Discharge status: Home / Self Care | Payer: BC Managed Care – PPO | Source: Ambulatory Visit | Attending: Primary Care | Admitting: Primary Care

## 2017-11-26 ENCOUNTER — Other Ambulatory Visit (INDEPENDENT_AMBULATORY_CARE_PROVIDER_SITE_OTHER): Payer: BC Managed Care – PPO

## 2017-11-26 DIAGNOSIS — E782 Mixed hyperlipidemia: Secondary | ICD-10-CM

## 2017-11-26 DIAGNOSIS — Z1231 Encounter for screening mammogram for malignant neoplasm of breast: Secondary | ICD-10-CM | POA: Insufficient documentation

## 2017-11-26 LAB — LIPID PANEL
CHOL/HDL RATIO: 4
Cholesterol: 174 mg/dL (ref 0–200)
HDL: 45.2 mg/dL (ref >39.00)
LDL CALC: 89 mg/dL (ref 0–99)
NonHDL: 128.66
TRIGLYCERIDES: 197 mg/dL — AB (ref 0.0–149.0)
VLDL: 39.4 mg/dL (ref 0.0–40.0)

## 2017-12-05 ENCOUNTER — Encounter: Payer: Self-pay | Admitting: *Deleted

## 2017-12-19 ENCOUNTER — Other Ambulatory Visit: Payer: Self-pay | Admitting: Primary Care

## 2017-12-19 DIAGNOSIS — G8929 Other chronic pain: Secondary | ICD-10-CM

## 2017-12-19 DIAGNOSIS — M79643 Pain in unspecified hand: Principal | ICD-10-CM

## 2017-12-19 NOTE — Telephone Encounter (Signed)
Last prescribed on  11/17/2017 Last office visit on 04/30/2017

## 2017-12-19 NOTE — Telephone Encounter (Signed)
Duplicate. She had two prescriptions for this medication in her list. She takes this PRN, last fill was one month ago. Deleted duplicate Rx from chart.

## 2018-01-13 ENCOUNTER — Ambulatory Visit: Payer: BC Managed Care – PPO | Admitting: Primary Care

## 2018-01-13 ENCOUNTER — Encounter: Payer: Self-pay | Admitting: Primary Care

## 2018-01-13 VITALS — BP 124/80 | HR 65 | Temp 98.2°F | Ht 63.0 in | Wt 230.2 lb

## 2018-01-13 DIAGNOSIS — I10 Essential (primary) hypertension: Secondary | ICD-10-CM | POA: Diagnosis not present

## 2018-01-13 DIAGNOSIS — E119 Type 2 diabetes mellitus without complications: Secondary | ICD-10-CM

## 2018-01-13 DIAGNOSIS — Z794 Long term (current) use of insulin: Secondary | ICD-10-CM | POA: Diagnosis not present

## 2018-01-13 DIAGNOSIS — G8929 Other chronic pain: Secondary | ICD-10-CM

## 2018-01-13 DIAGNOSIS — Z23 Encounter for immunization: Secondary | ICD-10-CM | POA: Diagnosis not present

## 2018-01-13 DIAGNOSIS — M79643 Pain in unspecified hand: Secondary | ICD-10-CM | POA: Diagnosis not present

## 2018-01-13 DIAGNOSIS — E782 Mixed hyperlipidemia: Secondary | ICD-10-CM

## 2018-01-13 LAB — MICROALBUMIN / CREATININE URINE RATIO
CREATININE, U: 121.8 mg/dL
MICROALB UR: 10.8 mg/dL — AB (ref 0.0–1.9)
Microalb Creat Ratio: 8.9 mg/g (ref 0.0–30.0)

## 2018-01-13 LAB — POCT GLYCOSYLATED HEMOGLOBIN (HGB A1C): Hemoglobin A1C: 5.6 % (ref 4.0–5.6)

## 2018-01-13 MED ORDER — DICLOFENAC SODIUM 75 MG PO TBEC: 75.0000 mg | DELAYED_RELEASE_TABLET | Freq: Two times a day (BID) | ORAL | 0 refills | Status: DC | PRN

## 2018-01-13 MED ORDER — ROSUVASTATIN CALCIUM 5 MG PO TABS: ORAL_TABLET | ORAL | 3 refills | Status: DC

## 2018-01-13 MED ORDER — LISINOPRIL 10 MG PO TABS: 10.0000 mg | ORAL_TABLET | Freq: Every day | ORAL | 0 refills | Status: DC

## 2018-01-13 NOTE — Progress Notes (Signed)
Subjective:    Patient ID: Olivia Moreno, female    DOB: 08/27/1965, 52 y.o.   MRN: [id number]  HPI  Ms. Olivia Moreno is a 52 year old female who presents today for follow up of type 2 diabetes. She is also experiencing more gout flares and will need to change BP medications.   1) Type 2 Diabetes:  Current medications include: Metformin 500 mg BID, Basaglar 10 units HS. She thinks she's doing 10 units but is not certain. She does not have her pen with her today.  She is checking her blood glucose 2 times weekly and is getting readings of: Bedtime, 2 hours after dinner: 80's-120's AM fasting: this morning 121  Highest reading: 150 Lowest reading: 80  Last A1C: 12.1 in March 2019 Last Eye Exam: Completed in January 2019 Last Foot Exam: Due Pneumonia Vaccination: Due ACE/ARB: Urine microalbumin due Statin: Crestor  Diet currently consists of:  Breakfast: String cheese Lunch: Salad, grilled chicken Dinner: Meat, vegetables, salad Snacks: None Desserts: 2-3 times weekly  Beverages: Water, diet soda  Exercise: She recently joined the gym, no recent exercise.   2) Essential Hypertension: Currently managed on bisoprolol-HCTZ 5-6.25 mg. She has a history of gout and is experiencing 2-3 gout flares over the last year. She is not checking her BP at home.   BP Readings from Last 3 Encounters:  01/13/18 124/80  04/30/17 122/82  07/11/16 140/88       Review of Systems  Eyes: Negative for visual disturbance.  Respiratory: Negative for shortness of breath.   Cardiovascular: Negative for chest pain.  Neurological: Negative for dizziness and numbness.       Past Medical History:  Diagnosis Date  . Gout   . Hyperlipidemia   . Hypertension      Social History   Socioeconomic History  . Marital status: Married    Spouse name: Not on file  . Number of children: 1  . Years of education: Not on file  . Highest education level: Not on file  Occupational History  .  Occupation: unemployed    Associate Professormployer: UNEMPLOYED  Social Needs  . Financial resource strain: Not on file  . Food insecurity:    Worry: Not on file    Inability: Not on file  . Transportation needs:    Medical: Not on file    Non-medical: Not on file  Tobacco Use  . Smoking status: Never Smoker  . Smokeless tobacco: Never Used  Substance and Sexual Activity  . Alcohol use: No  . Drug use: No  . Sexual activity: Not on file  Lifestyle  . Physical activity:    Days per week: Not on file    Minutes per session: Not on file  . Stress: Not on file  Relationships  . Social connections:    Talks on phone: Not on file    Gets together: Not on file    Attends religious service: Not on file    Active member of club or organization: Not on file    Attends meetings of clubs or organizations: Not on file    Relationship status: Not on file  . Intimate partner violence:    Fear of current or ex partner: Not on file    Emotionally abused: Not on file    Physically abused: Not on file    Forced sexual activity: Not on file  Other Topics Concern  . Not on file  Social History Narrative   Married.  1 child, 1 grandson.   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     No past surgical history on file.  Family History  Problem Relation Age of Onset  . Heart attack Mother   . Pulmonary embolism Father   . Pulmonary embolism Brother   . Breast cancer Neg Hx     Allergies  Allergen Reactions  . Penicillins Rash    Reaction as a child    Current Outpatient Medications on File Prior to Visit  Medication Sig Dispense Refill  . cetirizine (ZYRTEC) 10 MG tablet Take by mouth.    . colchicine (COLCRYS) 0.6 MG tablet Take 1 tablet (0.6 mg total) by mouth daily. 60 tablet 1  . glucose blood test strip Use three times daily as directed. 100 each 3  . Insulin Glargine (BASAGLAR KWIKPEN) 100 UNIT/ML SOPN Inject 0.1 mLs (10 Units total) into the skin at  bedtime. 15 mL 5  . Insulin Pen Needle (PEN NEEDLES) 31G X 5 MM MISC 1 Dose by Does not apply route every evening. Use every evening with insulin. 100 each 3  . metFORMIN (GLUCOPHAGE) 500 MG tablet TAKE 1 TABLET (500 MG TOTAL) BY MOUTH 2 (TWO) TIMES DAILY WITH A MEAL. 180 tablet 1  . Naftifine HCl (NAFTIN) 2 % GEL Apply to affected area twice daily. 60 g 3   No current facility-administered medications on file prior to visit.     BP 124/80   Pulse 65   Temp 98.2 F (36.8 C) (Oral)   Ht 5\' 3"  (1.6 m)   Wt 230 lb 4 oz (104.4 kg)   LMP 05/20/2010   SpO2 99%   BMI 40.79 kg/m    Objective:   Physical Exam  Constitutional: She appears well-nourished.  Neck: Neck supple.  Cardiovascular: Normal rate and regular rhythm.  Respiratory: Effort normal and breath sounds normal.  Skin: Skin is warm and dry.           Assessment & Plan:

## 2018-01-13 NOTE — Assessment & Plan Note (Signed)
A1C of 12.1 in March 2019, now 5.6 today.  Commended her on this success!  Would like to reduce Basaglar to 5 units, it doesn't appear that she's aware of how much insulin she's injecting, supposed to be injecting 10 units. Will have her send a picture of her pen on my chart, also discussed that she ask the pharmacist to take a look later today. She may need to bring her pen by tomorrow.   Continue metformin 500 mg BID. Foot and eye exam UTD. Urine microalbumin pending. Continue statin, start ACE.  Follow up in 6 months.

## 2018-01-13 NOTE — Assessment & Plan Note (Signed)
Stable on bisoprolol-HCTZ but given recurrent gout flares will need to discontinue. Switch to lisinopril 10 mg. Will have her monitor BP at home and return in 2-3 weeks for BP check and BMP.

## 2018-01-13 NOTE — Addendum Note (Signed)
Addended by: Tawnya CrookSAMBATH, Kayce Betty on: 01/13/2018 08:59 AM   Modules accepted: Orders

## 2018-01-13 NOTE — Patient Instructions (Addendum)
Continue your insulin for now. Please send me a picture of your pen through my chart. Also ask the pharmacist to take a look if possible.   Check your blood sugars once daily, rotating times of checks: Before any meal 2 hours after any meal Bedtime  Continue Metformin 500 mg twice daily.  Stop bisoprolol-hydrochlorothiazide medication for blood pressure.  Start lisinopril 10 mg for blood pressure.   Start monitoring your blood pressure daily, around the same time of day, for the next 2-3weeks.  Ensure that you have rested for 30 minutes prior to checking your blood pressure. Record your readings and bring them to your next visit.  Schedule a follow up visit in 2-3 weeks for blood pressure check.  It was a pleasure to see you today!

## 2018-01-13 NOTE — Assessment & Plan Note (Signed)
Improved on Crestor every other day, continue same.

## 2018-02-03 ENCOUNTER — Encounter: Payer: Self-pay | Admitting: Primary Care

## 2018-02-03 ENCOUNTER — Ambulatory Visit: Payer: BC Managed Care – PPO | Admitting: Primary Care

## 2018-02-03 DIAGNOSIS — I10 Essential (primary) hypertension: Secondary | ICD-10-CM | POA: Diagnosis not present

## 2018-02-03 NOTE — Patient Instructions (Signed)
We've increased the dose of your lisinopril to 20 mg. You may take two of your 10 mg tablets for now.   Continue to monitor your blood pressure.   Schedule a follow up visit in 2-3 weeks for blood pressure check.   It was a pleasure to see you today!

## 2018-02-03 NOTE — Assessment & Plan Note (Signed)
Above goal in the office today on lisinopril 10 mg, also with home readings. Increase dose to 20 mg and follow up in 2-3 weeks for BP check. BMP next visit.

## 2018-02-03 NOTE — Progress Notes (Signed)
Subjective:    Patient ID: Olivia Moreno, female    DOB: 1966/01/01, 52 y.o.   MRN: [id number]  HPI  Olivia Moreno is a 52 year old female who presents today for follow up of hypertension.   She was last evaluated on 01/13/18 with recurrent gout flares managed on bisoprolol-HCTZ needing to change regimens. Her blood pressure was well managed on this regimen. She was switched to lisinopril 10 mg and asked to monitor BP and follow up today.  Since her last visit she denies headaches, dizziness, cough. She has noticed feeling more emotional, she is more stressed at work. She's been checking her BP at home which is running 150's/80's. She denies gout flares since switching her regimen.  BP Readings from Last 3 Encounters:  02/03/18 (!) 146/76  01/13/18 124/80  04/30/17 122/82     Review of Systems  Eyes: Negative for visual disturbance.  Respiratory: Negative for cough and shortness of breath.   Cardiovascular: Negative for chest pain.  Neurological: Negative for dizziness and headaches.       Past Medical History:  Diagnosis Date  . Gout   . Hyperlipidemia   . Hypertension      Social History   Socioeconomic History  . Marital status: Married    Spouse name: Not on file  . Number of children: 1  . Years of education: Not on file  . Highest education level: Not on file  Occupational History  . Occupation: unemployed    Associate Professor: UNEMPLOYED  Social Needs  . Financial resource strain: Not on file  . Food insecurity:    Worry: Not on file    Inability: Not on file  . Transportation needs:    Medical: Not on file    Non-medical: Not on file  Tobacco Use  . Smoking status: Never Smoker  . Smokeless tobacco: Never Used  Substance and Sexual Activity  . Alcohol use: No  . Drug use: No  . Sexual activity: Not on file  Lifestyle  . Physical activity:    Days per week: Not on file    Minutes per session: Not on file  . Stress: Not on file  Relationships  .  Social connections:    Talks on phone: Not on file    Gets together: Not on file    Attends religious service: Not on file    Active member of club or organization: Not on file    Attends meetings of clubs or organizations: Not on file    Relationship status: Not on file  . Intimate partner violence:    Fear of current or ex partner: Not on file    Emotionally abused: Not on file    Physically abused: Not on file    Forced sexual activity: Not on file  Other Topics Concern  . Not on file  Social History Narrative   Married.   1 child, 1 grandson.   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     No past surgical history on file.  Family History  Problem Relation Age of Onset  . Heart attack Mother   . Pulmonary embolism Father   . Pulmonary embolism Brother   . Breast cancer Neg Hx     Allergies  Allergen Reactions  . Penicillins Rash    Reaction as a child    Current Outpatient Medications on File Prior to Visit  Medication Sig Dispense Refill  .  cetirizine (ZYRTEC) 10 MG tablet Take by mouth.    . colchicine (COLCRYS) 0.6 MG tablet Take 1 tablet (0.6 mg total) by mouth daily. 60 tablet 1  . diclofenac (VOLTAREN) 75 MG EC tablet Take 1 tablet (75 mg total) by mouth 2 (two) times daily as needed for moderate pain. 60 tablet 0  . glucose blood test strip Use three times daily as directed. 100 each 3  . lisinopril (PRINIVIL,ZESTRIL) 10 MG tablet Take 1 tablet (10 mg total) by mouth daily. For blood pressure. 90 tablet 0  . metFORMIN (GLUCOPHAGE) 500 MG tablet TAKE 1 TABLET (500 MG TOTAL) BY MOUTH 2 (TWO) TIMES DAILY WITH A MEAL. 180 tablet 1  . Naftifine HCl (NAFTIN) 2 % GEL Apply to affected area twice daily. 60 g 3  . rosuvastatin (CRESTOR) 5 MG tablet Take 1 tablet by mouth every other day for cholesterol. 45 tablet 3   No current facility-administered medications on file prior to visit.     BP (!) 146/76   Pulse 83   Temp 98.6 F  (37 C) (Oral)   Ht 5\' 3"  (1.6 m)   Wt 232 lb 8 oz (105.5 kg)   LMP 05/20/2010   SpO2 97%   BMI 41.19 kg/m    Objective:   Physical Exam  Constitutional: She appears well-nourished.  Neck: Neck supple.  Cardiovascular: Normal rate and regular rhythm.  Respiratory: Effort normal and breath sounds normal.  Skin: Skin is warm and dry.  Psychiatric: She has a normal mood and affect.           Assessment & Plan:

## 2018-02-04 ENCOUNTER — Other Ambulatory Visit: Payer: Self-pay | Admitting: Primary Care

## 2018-02-04 ENCOUNTER — Encounter: Payer: Self-pay | Admitting: *Deleted

## 2018-02-04 DIAGNOSIS — G8929 Other chronic pain: Secondary | ICD-10-CM

## 2018-02-04 DIAGNOSIS — M79643 Pain in unspecified hand: Principal | ICD-10-CM

## 2018-02-20 ENCOUNTER — Encounter: Payer: Self-pay | Admitting: Primary Care

## 2018-02-20 ENCOUNTER — Ambulatory Visit: Payer: BC Managed Care – PPO | Admitting: Primary Care

## 2018-02-20 VITALS — BP 156/94 | HR 103 | Temp 98.2°F | Ht 63.0 in | Wt 230.8 lb

## 2018-02-20 DIAGNOSIS — J209 Acute bronchitis, unspecified: Secondary | ICD-10-CM | POA: Diagnosis not present

## 2018-02-20 DIAGNOSIS — I10 Essential (primary) hypertension: Secondary | ICD-10-CM | POA: Diagnosis not present

## 2018-02-20 LAB — BASIC METABOLIC PANEL
BUN: 16 mg/dL (ref 6–23)
CHLORIDE: 106 meq/L (ref 96–112)
CO2: 27 meq/L (ref 19–32)
Calcium: 9.8 mg/dL (ref 8.4–10.5)
Creatinine, Ser: 1.08 mg/dL (ref 0.40–1.20)
GFR: 56.33 mL/min — ABNORMAL LOW (ref >60.00)
GLUCOSE: 149 mg/dL — AB (ref 70–99)
Potassium: 4.3 mEq/L (ref 3.5–5.1)
SODIUM: 141 meq/L (ref 135–145)

## 2018-02-20 MED ORDER — BENZONATATE 200 MG PO CAPS: 200.0000 mg | ORAL_CAPSULE | Freq: Three times a day (TID) | ORAL | 0 refills | Status: DC | PRN

## 2018-02-20 MED ORDER — LOSARTAN POTASSIUM 100 MG PO TABS: 100.0000 mg | ORAL_TABLET | Freq: Every day | ORAL | 0 refills | Status: DC

## 2018-02-20 MED ORDER — AZITHROMYCIN 250 MG PO TABS: ORAL_TABLET | ORAL | 0 refills | Status: DC

## 2018-02-20 NOTE — Progress Notes (Signed)
Subjective:    Patient ID: Olivia Moreno, female    DOB: [birth year], 53 y.o.   MRN: [id number]  HPI  Ms. Bolz is a 53 year old female who presents today for follow up of hypertension. She has a history of type 2 diabetes, sleep apnea, gout.   She was last evaluated on 02/03/18 with continued elevated blood pressure readings despite management with lisinopril 10 mg. Her dose was increased to 20 mg and she was encouraged to follow up 2-3 weeks later.  Since her last visit she's checking her BP at home and is getting readings of 150's/80's. She has noticed a cough for one month, worse over the last one week. Her cough is deep with a congested cough, this is what became worse one week ago.   She also reports chest congestion, runny nose, some fatigue but is able to function okay during the day. She denies fevers. Her grandson was ill with the same symptoms a few weeks ago.    BP Readings from Last 3 Encounters:  02/20/18 (!) 156/94  02/03/18 (!) 146/76  01/13/18 124/80     Review of Systems  Constitutional: Positive for fatigue. Negative for fever.  HENT: Positive for congestion, rhinorrhea and sore throat.   Respiratory: Positive for cough. Negative for shortness of breath.   Cardiovascular: Negative for chest pain.  Neurological: Negative for dizziness and headaches.       Past Medical History:  Diagnosis Date  . Gout   . Hyperlipidemia   . Hypertension      Social History   Socioeconomic History  . Marital status: Married    Spouse name: Not on file  . Number of children: 1  . Years of education: Not on file  . Highest education level: Not on file  Occupational History  . Occupation: unemployed    Associate Professor: UNEMPLOYED  Social Needs  . Financial resource strain: Not on file  . Food insecurity:    Worry: Not on file    Inability: Not on file  . Transportation needs:    Medical: Not on file    Non-medical: Not on file  Tobacco Use  . Smoking status: Never  Smoker  . Smokeless tobacco: Never Used  Substance and Sexual Activity  . Alcohol use: No  . Drug use: No  . Sexual activity: Not on file  Lifestyle  . Physical activity:    Days per week: Not on file    Minutes per session: Not on file  . Stress: Not on file  Relationships  . Social connections:    Talks on phone: Not on file    Gets together: Not on file    Attends religious service: Not on file    Active member of club or organization: Not on file    Attends meetings of clubs or organizations: Not on file    Relationship status: Not on file  . Intimate partner violence:    Fear of current or ex partner: Not on file    Emotionally abused: Not on file    Physically abused: Not on file    Forced sexual activity: Not on file  Other Topics Concern  . Not on file  Social History Narrative   Married.   1 child, 1 grandson.   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     No past surgical history on file.  Family History  Problem Relation Age of  Onset  . Heart attack Mother   . Pulmonary embolism Father   . Pulmonary embolism Brother   . Breast cancer Neg Hx     Allergies  Allergen Reactions  . Penicillins Rash    Reaction as a child    Current Outpatient Medications on File Prior to Visit  Medication Sig Dispense Refill  . cetirizine (ZYRTEC) 10 MG tablet Take by mouth.    . colchicine (COLCRYS) 0.6 MG tablet Take 1 tablet (0.6 mg total) by mouth daily. 60 tablet 1  . diclofenac (VOLTAREN) 75 MG EC tablet TAKE 1 TABLET (75 MG TOTAL) BY MOUTH 2 (TWO) TIMES DAILY AS NEEDED FOR MODERATE PAIN. 60 tablet 0  . glucose blood test strip Use three times daily as directed. 100 each 3  . metFORMIN (GLUCOPHAGE) 500 MG tablet TAKE 1 TABLET (500 MG TOTAL) BY MOUTH 2 (TWO) TIMES DAILY WITH A MEAL. 180 tablet 1  . Naftifine HCl (NAFTIN) 2 % GEL Apply to affected area twice daily. 60 g 3  . rosuvastatin (CRESTOR) 5 MG tablet Take 1 tablet by mouth  every other day for cholesterol. 45 tablet 3   No current facility-administered medications on file prior to visit.     BP (!) 156/94   Pulse (!) 103   Temp 98.2 F (36.8 C) (Oral)   Ht 5\' 3"  (1.6 m)   Wt 230 lb 12 oz (104.7 kg)   LMP 05/20/2010   SpO2 98%   BMI 40.88 kg/m    Objective:   Physical Exam  Constitutional: She appears well-nourished. She appears ill.  HENT:  Right Ear: Tympanic membrane and ear canal normal.  Left Ear: Tympanic membrane and ear canal normal.  Nose: No mucosal edema. Right sinus exhibits no maxillary sinus tenderness and no frontal sinus tenderness. Left sinus exhibits no maxillary sinus tenderness and no frontal sinus tenderness.  Mouth/Throat: Oropharynx is clear and moist.  Neck: Neck supple.  Cardiovascular: Normal rate and regular rhythm.  Respiratory: Effort normal. She has no wheezes. She has rhonchi in the right lower field.  Skin: Skin is warm and dry.           Assessment & Plan:  Acute Bronchitis:  Cough, congestion x 1 month, worse over the last week. Doubt ACE induced given exam and nature of cough. Rx for Zpak and Jerilynn Som sent to pharmacy. Stop lisinopril, switch to losartan.  Doreene Nest, NP

## 2018-02-20 NOTE — Assessment & Plan Note (Signed)
Above goal and in fact worse on lisinopril.  Lisinopril could be contributing to her cough. Stop lisinopril and switch to losartan. BMP pending today. Follow up in 2-3 weeks.

## 2018-02-20 NOTE — Patient Instructions (Signed)
Start Azithromycin antibiotics for infection. Take 2 tablets by mouth today, then 1 tablet daily for 4 additional days.  You may take Benzonatate capsules for cough. Take 1 capsule by mouth three times daily as needed for cough.  Stop lisinopril tablets for blood pressure.  Start losartan 100 mg tablets for blood pressure.  Stop by the lab prior to leaving today. I will notify you of your results once received.   Continue to monitor your blood pressure and return in 2-3 weeks for blood pressure check.  It was a pleasure to see you today!

## 2018-03-02 ENCOUNTER — Other Ambulatory Visit: Payer: Self-pay | Admitting: Primary Care

## 2018-03-02 DIAGNOSIS — M79643 Pain in unspecified hand: Principal | ICD-10-CM

## 2018-03-02 DIAGNOSIS — G8929 Other chronic pain: Secondary | ICD-10-CM

## 2018-03-02 NOTE — Telephone Encounter (Signed)
Electronic refill request. Diclofenac Last office visit:   02/20/2018 Last Filled:    60 tablet 0 02/04/2018  Please advise.

## 2018-03-03 NOTE — Telephone Encounter (Signed)
See my chart messages. Will trial Tylenol Arthritis.

## 2018-03-03 NOTE — Telephone Encounter (Signed)
My chart message sent to patient regarding need for refills. Awaiting response.

## 2018-03-13 ENCOUNTER — Ambulatory Visit: Payer: BC Managed Care – PPO | Admitting: Primary Care

## 2018-03-13 VITALS — BP 144/84 | HR 105 | Temp 98.6°F | Ht 63.0 in | Wt 230.5 lb

## 2018-03-13 DIAGNOSIS — M10471 Other secondary gout, right ankle and foot: Secondary | ICD-10-CM | POA: Diagnosis not present

## 2018-03-13 DIAGNOSIS — N289 Disorder of kidney and ureter, unspecified: Secondary | ICD-10-CM

## 2018-03-13 DIAGNOSIS — R768 Other specified abnormal immunological findings in serum: Secondary | ICD-10-CM

## 2018-03-13 DIAGNOSIS — E119 Type 2 diabetes mellitus without complications: Secondary | ICD-10-CM

## 2018-03-13 DIAGNOSIS — I10 Essential (primary) hypertension: Secondary | ICD-10-CM

## 2018-03-13 DIAGNOSIS — R7689 Other specified abnormal immunological findings in serum: Secondary | ICD-10-CM

## 2018-03-13 LAB — COMPREHENSIVE METABOLIC PANEL
ALT: 34 U/L (ref 0–35)
AST: 32 U/L (ref 0–37)
Albumin: 4.5 g/dL (ref 3.5–5.2)
Alkaline Phosphatase: 97 U/L (ref 39–117)
BUN: 22 mg/dL (ref 6–23)
CO2: 25 mEq/L (ref 19–32)
Calcium: 10 mg/dL (ref 8.4–10.5)
Chloride: 108 mEq/L (ref 96–112)
Creatinine, Ser: 1.07 mg/dL (ref 0.40–1.20)
GFR: 53.56 mL/min — ABNORMAL LOW (ref >60.00)
Glucose, Bld: 133 mg/dL — ABNORMAL HIGH (ref 70–99)
Potassium: 4.4 mEq/L (ref 3.5–5.1)
SODIUM: 142 meq/L (ref 135–145)
Total Bilirubin: 0.6 mg/dL (ref 0.2–1.2)
Total Protein: 7.9 g/dL (ref 6.0–8.3)

## 2018-03-13 LAB — C-REACTIVE PROTEIN: CRP: 0.2 mg/dL — ABNORMAL LOW (ref 0.5–20.0)

## 2018-03-13 LAB — SEDIMENTATION RATE: Sed Rate: 31 mm/hr — ABNORMAL HIGH (ref 0–30)

## 2018-03-13 LAB — URIC ACID: Uric Acid, Serum: 7.2 mg/dL — ABNORMAL HIGH (ref 2.4–7.0)

## 2018-03-13 MED ORDER — PREDNISONE 20 MG PO TABS: ORAL_TABLET | ORAL | 0 refills | Status: DC

## 2018-03-13 NOTE — Assessment & Plan Note (Signed)
Improved on losartan 100 mg, home readings even better. Suspect elevated levels today given gout flare/pain. Will have her monitor BP at home and report readings at or above 135/90. BMP pending.

## 2018-03-13 NOTE — Assessment & Plan Note (Signed)
Repeat A1C in one month.

## 2018-03-13 NOTE — Patient Instructions (Signed)
Stop by the lab prior to leaving today. I will notify you of your results once received.   Start prednisone 20 mg tablets. Take 2 tablets daily for four days, then 1 tablet daily for four days for gout flare.  Do not use diclofenac, ibuprofen, Advil, Motrin, naproxen, Aleve as these can be toxic to the kidneys.   Please notify me if you have any problems at the pharmacy with your losartan.  Schedule a follow up visit after February 26th for diabetes and blood pressure check.  It was a pleasure to see you today!

## 2018-03-13 NOTE — Assessment & Plan Note (Signed)
Acute flare today, little improvement on colchicine. Rx for prednisone taper sent to pharmacy.  Discussed to monitor glucose readings on prednisone.

## 2018-03-13 NOTE — Progress Notes (Signed)
Subjective:    Patient ID: Olivia Moreno, female    DOB: 1965/11/05, 53 y.o.   MRN: [id number]  HPI  Olivia Moreno is a 53 year old female who presents today for follow up of hypertension, she also has a chief complaint of gout flare.  1) Essential Hypertension: Last evaluated on 02/20/18 for follow up of hypertension, BP worse on lisinopril 20 mg and also developed a cough. Given this she was switched to losartan 100 mg and asked to follow up several weeks later.  BP Readings from Last 3 Encounters:  03/13/18 (!) 144/84  02/20/18 (!) 156/94  02/03/18 (!) 146/76   Since her last visit she's checking her BP at home which is running 130's/80's. She denies dizziness, chest pain, shortness of breath. She has not taken her diclofenac since her last visit as GFR was noted to be 56.  2) Gout: Located to the left great toe which began four days ago. Last gout flare was in November 2019, then prior to that in July 2019. She's been taking colchicine twice daily for the last four days without much improvement.   Review of Systems  Eyes: Negative for visual disturbance.  Respiratory: Negative for shortness of breath.   Cardiovascular: Negative for chest pain.  Musculoskeletal: Positive for arthralgias. Negative for joint swelling.  Skin: Positive for color change.  Neurological: Negative for dizziness.       Past Medical History:  Diagnosis Date  . Gout   . Hyperlipidemia   . Hypertension      Social History   Socioeconomic History  . Marital status: Married    Spouse name: Not on file  . Number of children: 1  . Years of education: Not on file  . Highest education level: Not on file  Occupational History  . Occupation: unemployed    Associate Professor: UNEMPLOYED  Social Needs  . Financial resource strain: Not on file  . Food insecurity:    Worry: Not on file    Inability: Not on file  . Transportation needs:    Medical: Not on file    Non-medical: Not on file  Tobacco Use  .  Smoking status: Never Smoker  . Smokeless tobacco: Never Used  Substance and Sexual Activity  . Alcohol use: No  . Drug use: No  . Sexual activity: Not on file  Lifestyle  . Physical activity:    Days per week: Not on file    Minutes per session: Not on file  . Stress: Not on file  Relationships  . Social connections:    Talks on phone: Not on file    Gets together: Not on file    Attends religious service: Not on file    Active member of club or organization: Not on file    Attends meetings of clubs or organizations: Not on file    Relationship status: Not on file  . Intimate partner violence:    Fear of current or ex partner: Not on file    Emotionally abused: Not on file    Physically abused: Not on file    Forced sexual activity: Not on file  Other Topics Concern  . Not on file  Social History Narrative   Married.   1 child, 1 grandson.   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     No past surgical history on file.  Family History  Problem Relation Age of Onset  .  Heart attack Mother   . Pulmonary embolism Father   . Pulmonary embolism Brother   . Breast cancer Neg Hx     Allergies  Allergen Reactions  . Penicillins Rash    Reaction as a child    Current Outpatient Medications on File Prior to Visit  Medication Sig Dispense Refill  . cetirizine (ZYRTEC) 10 MG tablet Take by mouth.    . colchicine (COLCRYS) 0.6 MG tablet Take 1 tablet (0.6 mg total) by mouth daily. 60 tablet 1  . diclofenac (VOLTAREN) 75 MG EC tablet TAKE 1 TABLET (75 MG TOTAL) BY MOUTH 2 (TWO) TIMES DAILY AS NEEDED FOR MODERATE PAIN. 60 tablet 0  . glucose blood test strip Use three times daily as directed. 100 each 3  . losartan (COZAAR) 100 MG tablet Take 1 tablet (100 mg total) by mouth daily. For blood pressure. 90 tablet 0  . metFORMIN (GLUCOPHAGE) 500 MG tablet TAKE 1 TABLET (500 MG TOTAL) BY MOUTH 2 (TWO) TIMES DAILY WITH A MEAL. 180 tablet 1  .  Naftifine HCl (NAFTIN) 2 % GEL Apply to affected area twice daily. 60 g 3  . rosuvastatin (CRESTOR) 5 MG tablet Take 1 tablet by mouth every other day for cholesterol. 45 tablet 3   No current facility-administered medications on file prior to visit.     BP (!) 144/84   Pulse (!) 105   Temp 98.6 F (37 C) (Oral)   Ht 5\' 3"  (1.6 m)   Wt 230 lb 8 oz (104.6 kg)   LMP 05/20/2010   SpO2 98%   BMI 40.83 kg/m    Objective:   Physical Exam  Constitutional: She appears well-nourished.  Neck: Neck supple.  Cardiovascular: Normal rate and regular rhythm.  Respiratory: Effort normal and breath sounds normal.  Musculoskeletal:       Feet:     Comments: Moderate swelling with erythema to left metatarsal joint of great toe  Skin: Skin is warm and dry. There is erythema.  Psychiatric: She has a normal mood and affect.           Assessment & Plan:

## 2018-03-13 NOTE — Assessment & Plan Note (Signed)
Noted on labs from March 2019, never connected with rheumatology as referred. Repeat labs pending, will refer once labs return.

## 2018-03-13 NOTE — Assessment & Plan Note (Signed)
Noted on prior labs, was taking diclofenac daily for chronic arthralgias.  No use of diclofenac in several weeks, repeat renal function pending. Discussed to refrain from NSAID's. Now managed on ARB.

## 2018-03-15 ENCOUNTER — Other Ambulatory Visit: Payer: Self-pay | Admitting: Primary Care

## 2018-03-15 DIAGNOSIS — R768 Other specified abnormal immunological findings in serum: Secondary | ICD-10-CM

## 2018-03-15 DIAGNOSIS — N289 Disorder of kidney and ureter, unspecified: Secondary | ICD-10-CM

## 2018-03-16 LAB — CYCLIC CITRUL PEPTIDE ANTIBODY, IGG: Cyclic Citrullin Peptide Ab: <16 UNITS

## 2018-03-16 LAB — RHEUMATOID FACTOR: Rheumatoid fact SerPl-aCnc: 23 IU/mL — ABNORMAL HIGH (ref <14)

## 2018-03-31 ENCOUNTER — Ambulatory Visit (INDEPENDENT_AMBULATORY_CARE_PROVIDER_SITE_OTHER): Payer: BC Managed Care – PPO | Admitting: Podiatry

## 2018-03-31 ENCOUNTER — Ambulatory Visit (INDEPENDENT_AMBULATORY_CARE_PROVIDER_SITE_OTHER): Payer: BC Managed Care – PPO

## 2018-03-31 ENCOUNTER — Other Ambulatory Visit: Payer: Self-pay | Admitting: Podiatry

## 2018-03-31 ENCOUNTER — Encounter: Payer: Self-pay | Admitting: Podiatry

## 2018-03-31 DIAGNOSIS — M7752 Other enthesopathy of left foot: Secondary | ICD-10-CM

## 2018-03-31 DIAGNOSIS — M109 Gout, unspecified: Secondary | ICD-10-CM

## 2018-03-31 MED ORDER — ALLOPURINOL 100 MG PO TABS: 100.0000 mg | ORAL_TABLET | Freq: Every day | ORAL | 6 refills | Status: DC

## 2018-04-05 ENCOUNTER — Other Ambulatory Visit: Payer: Self-pay | Admitting: Primary Care

## 2018-04-05 DIAGNOSIS — I10 Essential (primary) hypertension: Secondary | ICD-10-CM

## 2018-04-06 NOTE — Progress Notes (Signed)
   HPI: 53 year old female presenting today for follow up evaluation of right great toe pain secondary to gout/1st MPJ capsulitis. She states the pain has improved but she is now experiencing the same symptoms in the left hallux. She was prescribed Prednisone last month by her PCP. Touching the area and walking increases the pain. Patient is here for further evaluation and treatment.   Past Medical History:  Diagnosis Date  . Gout   . Hyperlipidemia   . Hypertension      Physical Exam: General: The patient is alert and oriented x3 in no acute distress.  Dermatology: Skin is warm, dry and supple bilateral lower extremities. Negative for open lesions or macerations.  Vascular: Palpable pedal pulses bilaterally. Capillary refill within normal limits.  Neurological: Epicritic and protective threshold grossly intact bilaterally.   Musculoskeletal Exam: Pain on palpation to the left 1st MPJ with erythema and edema.  Radiographic Exam:  Normal osseous mineralization. Joint spaces preserved. No fracture/dislocation/boney destruction.    Assessment: 1. 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 Patient evaluated. X-Rays reviewed.  2. Injection of 0.5 mLs Celestone Soluspan injected into the 1st MPJ of the left foot.  3. Patient recently finished a Medrol Dose Pak.  4. Cannot take NSAIDs due to renal impairment.  5. Prescription for Allopurinol 100 mg provided to patient.  6. Return to clinic in 4 weeks.       Felecia Shelling, DPM [facility]  Dr. Felecia Shelling, DPM    [address] [place], [place] [address]                Office [phone]  Fax [phone]

## 2018-04-17 ENCOUNTER — Ambulatory Visit: Payer: BC Managed Care – PPO | Admitting: Primary Care

## 2018-04-17 ENCOUNTER — Encounter: Payer: Self-pay | Admitting: Primary Care

## 2018-04-17 VITALS — BP 146/76 | HR 87 | Temp 98.2°F | Ht 63.0 in | Wt 230.5 lb

## 2018-04-17 DIAGNOSIS — M10471 Other secondary gout, right ankle and foot: Secondary | ICD-10-CM | POA: Diagnosis not present

## 2018-04-17 DIAGNOSIS — N289 Disorder of kidney and ureter, unspecified: Secondary | ICD-10-CM | POA: Diagnosis not present

## 2018-04-17 DIAGNOSIS — E119 Type 2 diabetes mellitus without complications: Secondary | ICD-10-CM

## 2018-04-17 DIAGNOSIS — I10 Essential (primary) hypertension: Secondary | ICD-10-CM | POA: Diagnosis not present

## 2018-04-17 DIAGNOSIS — E782 Mixed hyperlipidemia: Secondary | ICD-10-CM

## 2018-04-17 LAB — BASIC METABOLIC PANEL
BUN: 20 mg/dL (ref 6–23)
CO2: 26 mEq/L (ref 19–32)
Calcium: 10.1 mg/dL (ref 8.4–10.5)
Chloride: 104 mEq/L (ref 96–112)
Creatinine, Ser: 1.12 mg/dL (ref 0.40–1.20)
GFR: 50.79 mL/min — ABNORMAL LOW (ref >60.00)
GLUCOSE: 144 mg/dL — AB (ref 70–99)
Potassium: 4.3 mEq/L (ref 3.5–5.1)
Sodium: 140 mEq/L (ref 135–145)

## 2018-04-17 LAB — HEMOGLOBIN A1C: Hgb A1c MFr Bld: 6.1 % (ref 4.6–6.5)

## 2018-04-17 MED ORDER — ROSUVASTATIN CALCIUM 5 MG PO TABS: 5.0000 mg | ORAL_TABLET | Freq: Every day | ORAL | 3 refills | Status: DC

## 2018-04-17 MED ORDER — AMLODIPINE BESYLATE 5 MG PO TABS: 5.0000 mg | ORAL_TABLET | Freq: Every day | ORAL | 0 refills | Status: DC

## 2018-04-17 NOTE — Assessment & Plan Note (Signed)
Improved, not at goal. Home readings are borderline too high. Continue losartan 100 mg, add in Amlodipine 5 mg daily. She will continue to monitor BP and update if readings are at or above 135/90. BMP pending.

## 2018-04-17 NOTE — Patient Instructions (Addendum)
Continue losartan 100 mg. Start amlodipine 5 mg for blood pressure. Take once daily.  Stop by the lab prior to leaving today. I will notify you of your results once received.   Follow up with the kidney doctor as scheduled. Call Dr. Ardith Dark office through [facility] for your appointment.  Please schedule a follow up appointment in 6 months for your physical.   It was a pleasure to see you today!

## 2018-04-17 NOTE — Progress Notes (Signed)
Subjective:    Patient ID: Olivia Moreno, female    DOB: [birth year], 53 y.o.   MRN: [id number]  HPI  Olivia Moreno is a 53 year old female who presents today for follow up.  1) Type 2 Diabetes:   Current medications include: Metformin 500 mg BID  She is checking his/her blood glucose 1-2 times daily and is getting readings of: AM fasting: Below 120, this morning 140 Bedtime: 120-150  Highest reading: 176 Lowest reading: 80  Last A1C: 5.6 in November 2019 Last Eye Exam: She will call to schedule Last Foot Exam: Due in November 2020 Pneumonia Vaccination: Completed in 2019 ACE/ARB: Losartan Statin: rosuvastatin   Diet currently consists of:  Breakfast: Waffles, pancakes  Lunch: Salad with protein Dinner: Protein, vegetables, occasionally starch Snacks: Crackers Desserts: 3-4 days weekly  Beverages: Water, diet soda  Exercise: She is not exercising  2) Essential Hypertension: Currently managed on losartan 100 mg. She is checking her BP at home which is running mid 130's/80's. She denies chest pain, dizziness.   BP Readings from Last 3 Encounters:  04/17/18 (!) 146/76  03/13/18 (!) 144/84  02/20/18 (!) 156/94     3) Decreased Renal Function: Currently managed on losartan 100 mg for hypertension. Renal function during her visit one month ago with stable creatinine, decrease in GFR from 56 to 53. She was taking diclofenac at that time. She is due for repeat BMP today. Since her last visit she's not taken any NSAID's. She is scheduled to see nephrology March 19th.    Review of Systems  Eyes: Negative for visual disturbance.  Respiratory: Negative for shortness of breath.   Cardiovascular: Negative for chest pain.  Musculoskeletal: Positive for arthralgias.  Neurological: Negative for dizziness and headaches.       Past Medical History:  Diagnosis Date  . Gout   . Hyperlipidemia   . Hypertension      Social History   Socioeconomic History  . Marital  status: Married    Spouse name: Not on file  . Number of children: 1  . Years of education: Not on file  . Highest education level: Not on file  Occupational History  . Occupation: unemployed    Associate Professor: UNEMPLOYED  Social Needs  . Financial resource strain: Not on file  . Food insecurity:    Worry: Not on file    Inability: Not on file  . Transportation needs:    Medical: Not on file    Non-medical: Not on file  Tobacco Use  . Smoking status: Never Smoker  . Smokeless tobacco: Never Used  Substance and Sexual Activity  . Alcohol use: No  . Drug use: No  . Sexual activity: Not on file  Lifestyle  . Physical activity:    Days per week: Not on file    Minutes per session: Not on file  . Stress: Not on file  Relationships  . Social connections:    Talks on phone: Not on file    Gets together: Not on file    Attends religious service: Not on file    Active member of club or organization: Not on file    Attends meetings of clubs or organizations: Not on file    Relationship status: Not on file  . Intimate partner violence:    Fear of current or ex partner: Not on file    Emotionally abused: Not on file    Physically abused: Not on file    Forced sexual  activity: Not on file  Other Topics Concern  . Not on file  Social History Narrative   Married.   1 child, 1 grandson.   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     No past surgical history on file.  Family History  Problem Relation Age of Onset  . Heart attack Mother   . Pulmonary embolism Father   . Pulmonary embolism Brother   . Breast cancer Neg Hx     Allergies  Allergen Reactions  . Penicillins Rash    Reaction as a child    Current Outpatient Medications on File Prior to Visit  Medication Sig Dispense Refill  . allopurinol (ZYLOPRIM) 100 MG tablet Take 1 tablet (100 mg total) by mouth daily. 30 tablet 6  . cetirizine (ZYRTEC) 10 MG tablet Take by mouth.    Marland Kitchen  glucose blood test strip Use three times daily as directed. 100 each 3  . losartan (COZAAR) 100 MG tablet Take 1 tablet (100 mg total) by mouth daily. For blood pressure. 90 tablet 0  . metFORMIN (GLUCOPHAGE) 500 MG tablet TAKE 1 TABLET (500 MG TOTAL) BY MOUTH 2 (TWO) TIMES DAILY WITH A MEAL. 180 tablet 1  . Naftifine HCl (NAFTIN) 2 % GEL Apply to affected area twice daily. 60 g 3  . rosuvastatin (CRESTOR) 5 MG tablet Take 1 tablet by mouth every other day for cholesterol. 45 tablet 3   No current facility-administered medications on file prior to visit.     BP (!) 146/76   Pulse 87   Temp 98.2 F (36.8 C) (Oral)   Ht 5\' 3"  (1.6 m)   Wt 230 lb 8 oz (104.6 kg)   LMP 05/20/2010   SpO2 97%   BMI 40.83 kg/m    Objective:   Physical Exam  Constitutional: She appears well-nourished.  Neck: Neck supple.  Cardiovascular: Normal rate and regular rhythm.  Respiratory: Effort normal and breath sounds normal.  Skin: Skin is warm and dry.  Psychiatric: She has a normal mood and affect.           Assessment & Plan:

## 2018-04-17 NOTE — Assessment & Plan Note (Signed)
Repeat A1C pending. Recommended to work on diet, start with regular exercise. Foot exam UTD. Pneumonia vaccination UTD. Managed on ARB and statin. Discussed to schedule an eye exam. Continue Metformin. Follow up in 6 months.

## 2018-04-17 NOTE — Addendum Note (Signed)
Addended by: Doreene Nest on: 04/17/2018 07:46 AM   Modules accepted: Orders

## 2018-04-17 NOTE — Assessment & Plan Note (Signed)
Will be seeing nephrology next month. Repeat BMP pending. Discussed to continue to avoid NSAID's. Managed on ARB.

## 2018-04-17 NOTE — Assessment & Plan Note (Signed)
Recent visit with podiatry, initiated on allopurinol. Continue same. Avoid HCTZ.

## 2018-04-28 ENCOUNTER — Encounter: Payer: Self-pay | Admitting: Podiatry

## 2018-04-28 ENCOUNTER — Ambulatory Visit: Payer: BC Managed Care – PPO | Admitting: Podiatry

## 2018-04-28 DIAGNOSIS — M7662 Achilles tendinitis, left leg: Secondary | ICD-10-CM | POA: Diagnosis not present

## 2018-04-28 DIAGNOSIS — M7752 Other enthesopathy of left foot: Secondary | ICD-10-CM

## 2018-04-30 NOTE — Progress Notes (Signed)
   HPI: 53 year old female presenting today for follow up evaluation of right great toe pain secondary to gout/1st MPJ capsulitis. She reports some mild continued pain but states it is better than it previously was. She has a new complaint of left heel pain that began a few weeks ago. Wearing shoes and walking increases the pain. She has been taking OTC Tylenol Arthritis and wearing softer shoes for treatment. Patient is here for further evaluation and treatment.   Past Medical History:  Diagnosis Date  . Gout   . Hyperlipidemia   . Hypertension      Physical Exam: General: The patient is alert and oriented x3 in no acute distress.  Dermatology: Skin is warm, dry and supple bilateral lower extremities. Negative for open lesions or macerations.  Vascular: Palpable pedal pulses bilaterally. Capillary refill within normal limits.  Neurological: Epicritic and protective threshold grossly intact bilaterally.   Musculoskeletal Exam: Pain on palpation to the left 1st MPJ with erythema and edema. Pain on palpation noted to the posterior tubercle of the left calcaneus at the insertion of the Achilles tendon consistent with retrocalcaneal bursitis.   Assessment: 1. 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 Insertional Achilles tendinitis left    Plan of Care:  1. Patient evaluated.   2. Injection of 0.5 mLs Celestone Soluspan injected into the 1st MPJ of the left foot.  3. Continue taking OTC Tylenol Arthritis.  4. Prescription for antiinflammatory cream to be dispensed by [facility].  5. Silicone heel sleeve dispensed.  6. Return to clinic in 6 weeks.        Felecia Shelling, DPM [facility]  Dr. Felecia Shelling, DPM    [address] [place], [place] [address]                Office [phone]  Fax [phone]

## 2018-05-16 ENCOUNTER — Other Ambulatory Visit: Payer: Self-pay | Admitting: Primary Care

## 2018-05-16 DIAGNOSIS — I10 Essential (primary) hypertension: Secondary | ICD-10-CM

## 2018-05-17 ENCOUNTER — Other Ambulatory Visit: Payer: Self-pay | Admitting: Primary Care

## 2018-05-17 DIAGNOSIS — E119 Type 2 diabetes mellitus without complications: Secondary | ICD-10-CM

## 2018-06-09 ENCOUNTER — Ambulatory Visit: Payer: BC Managed Care – PPO | Admitting: Podiatry

## 2018-07-21 ENCOUNTER — Other Ambulatory Visit: Payer: Self-pay | Admitting: Primary Care

## 2018-07-21 DIAGNOSIS — I10 Essential (primary) hypertension: Secondary | ICD-10-CM

## 2018-09-20 ENCOUNTER — Other Ambulatory Visit: Payer: Self-pay | Admitting: Primary Care

## 2018-09-20 DIAGNOSIS — M10471 Other secondary gout, right ankle and foot: Secondary | ICD-10-CM

## 2018-09-20 DIAGNOSIS — N289 Disorder of kidney and ureter, unspecified: Secondary | ICD-10-CM

## 2018-09-20 DIAGNOSIS — I10 Essential (primary) hypertension: Secondary | ICD-10-CM

## 2018-09-20 DIAGNOSIS — M1A9XX0 Chronic gout, unspecified, without tophus (tophi): Secondary | ICD-10-CM

## 2018-09-20 DIAGNOSIS — E7800 Pure hypercholesterolemia, unspecified: Secondary | ICD-10-CM

## 2018-09-20 DIAGNOSIS — E119 Type 2 diabetes mellitus without complications: Secondary | ICD-10-CM

## 2018-09-25 ENCOUNTER — Other Ambulatory Visit: Payer: BC Managed Care – PPO

## 2018-10-02 ENCOUNTER — Encounter: Payer: BC Managed Care – PPO | Admitting: Primary Care

## 2018-11-14 ENCOUNTER — Other Ambulatory Visit: Payer: Self-pay | Admitting: Primary Care

## 2018-11-14 DIAGNOSIS — I10 Essential (primary) hypertension: Secondary | ICD-10-CM

## 2018-11-14 DIAGNOSIS — E119 Type 2 diabetes mellitus without complications: Secondary | ICD-10-CM

## 2018-11-18 ENCOUNTER — Other Ambulatory Visit: Payer: Self-pay | Admitting: Primary Care

## 2018-11-18 DIAGNOSIS — I10 Essential (primary) hypertension: Secondary | ICD-10-CM

## 2018-11-18 DIAGNOSIS — E119 Type 2 diabetes mellitus without complications: Secondary | ICD-10-CM

## 2018-11-19 ENCOUNTER — Other Ambulatory Visit: Payer: Self-pay | Admitting: Primary Care

## 2018-11-19 DIAGNOSIS — I10 Essential (primary) hypertension: Secondary | ICD-10-CM

## 2019-01-24 ENCOUNTER — Other Ambulatory Visit: Payer: Self-pay | Admitting: Primary Care

## 2019-01-24 DIAGNOSIS — E119 Type 2 diabetes mellitus without complications: Secondary | ICD-10-CM

## 2019-02-15 ENCOUNTER — Other Ambulatory Visit: Payer: Self-pay | Admitting: Primary Care

## 2019-02-15 DIAGNOSIS — E782 Mixed hyperlipidemia: Secondary | ICD-10-CM

## 2019-03-20 IMAGING — MG MM DIGITAL SCREENING BILAT W/ TOMO W/ CAD
8 of 15 series · 8 of 40 positions shown · non-contrast
Comparison: Previous exam(s).

CLINICAL DATA: Screening.

EXAM:
DIGITAL SCREENING BILATERAL MAMMOGRAM WITH TOMO AND CAD

[R MLO synth-2D (1 of 3)]
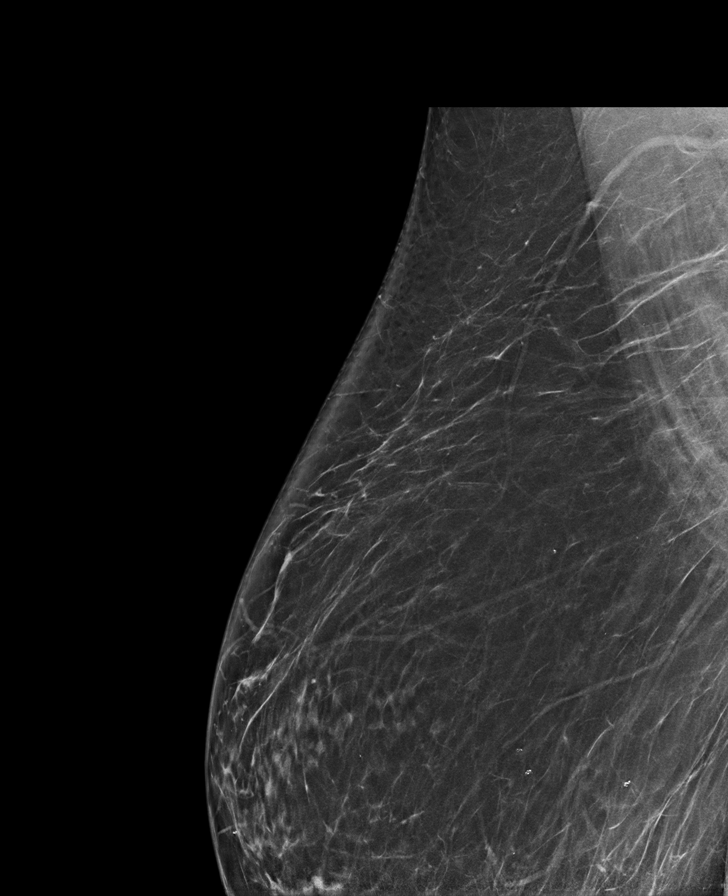

[R MLO synth-2D (2 of 3)]
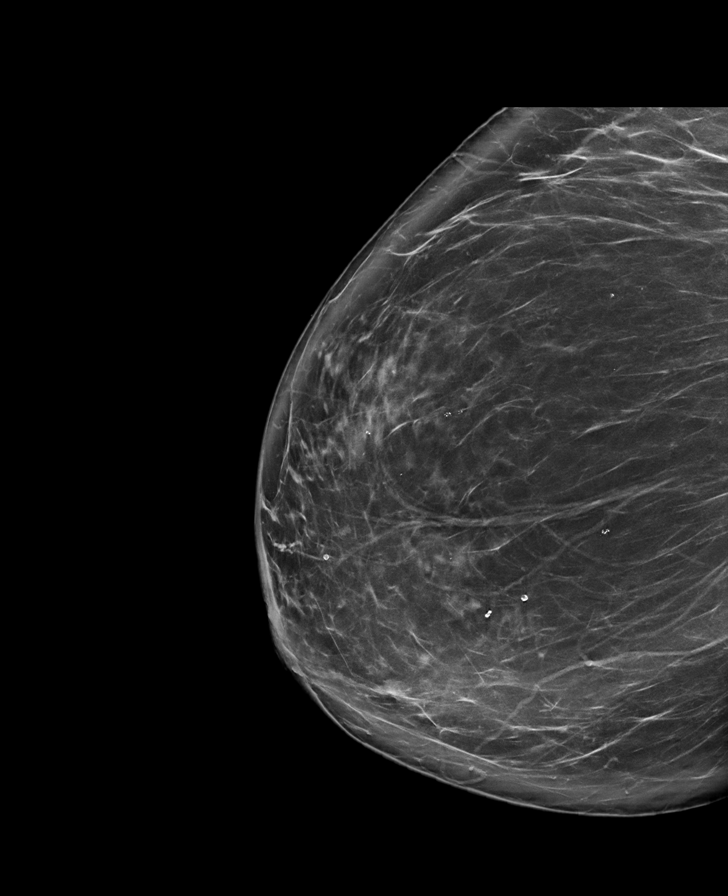

[R CC synth-2D]
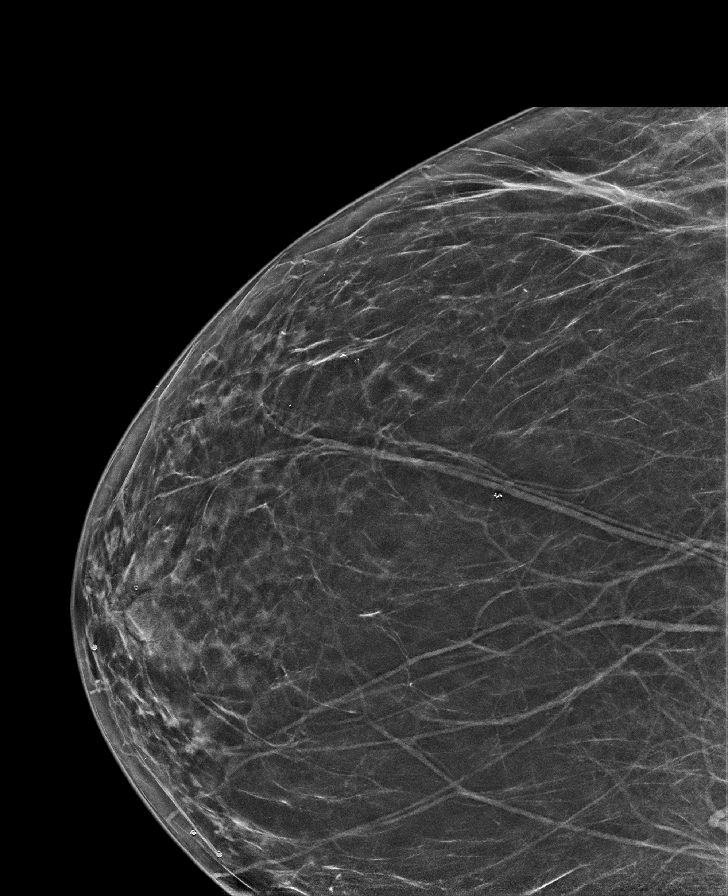

[L MLO synth-2D (1 of 2)]
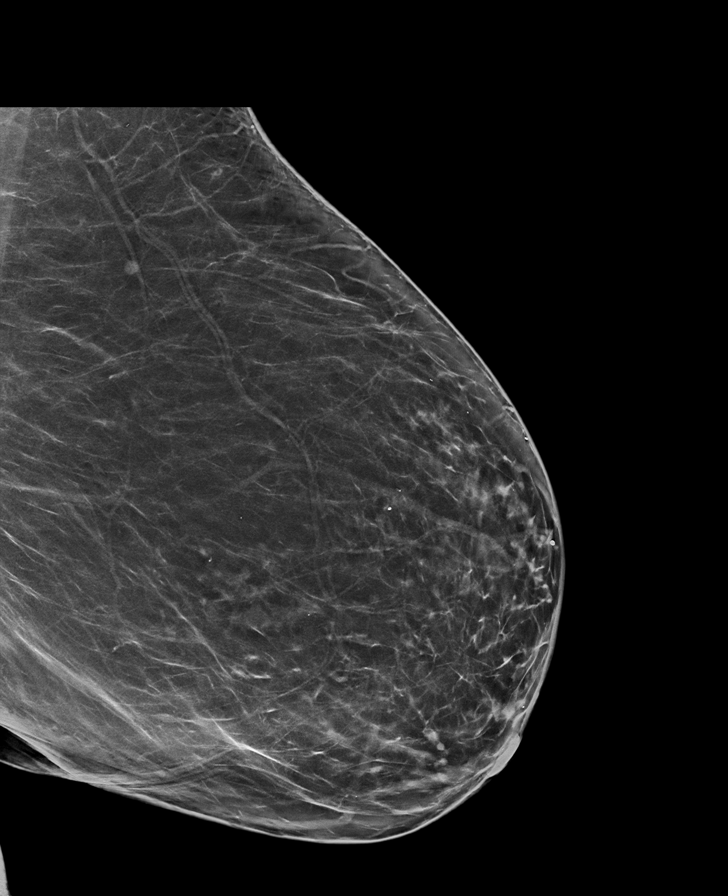

[R MLO synth-2D (3 of 3)]
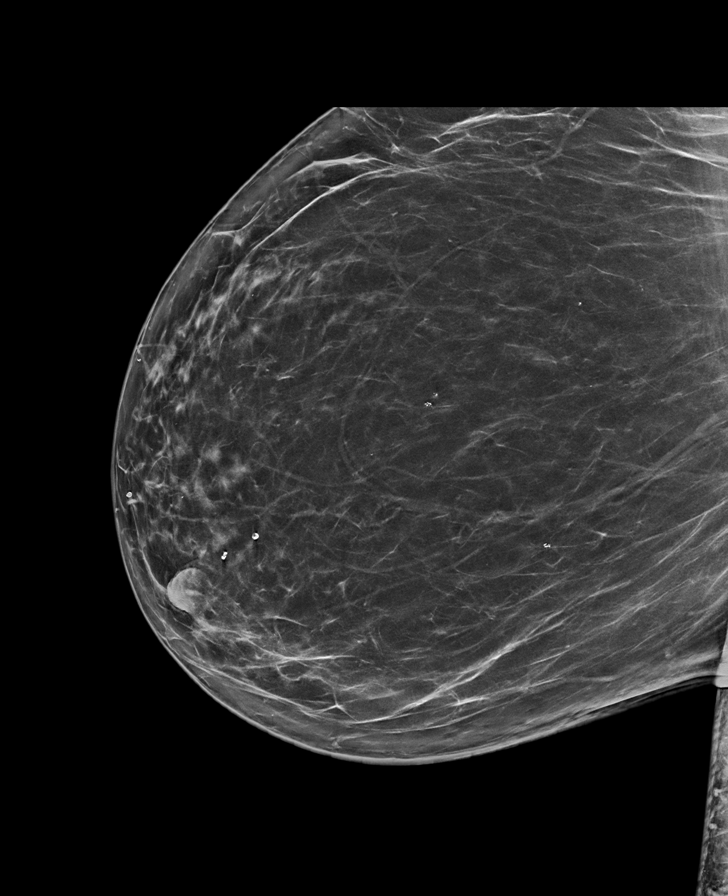

[R CV synth-2D]
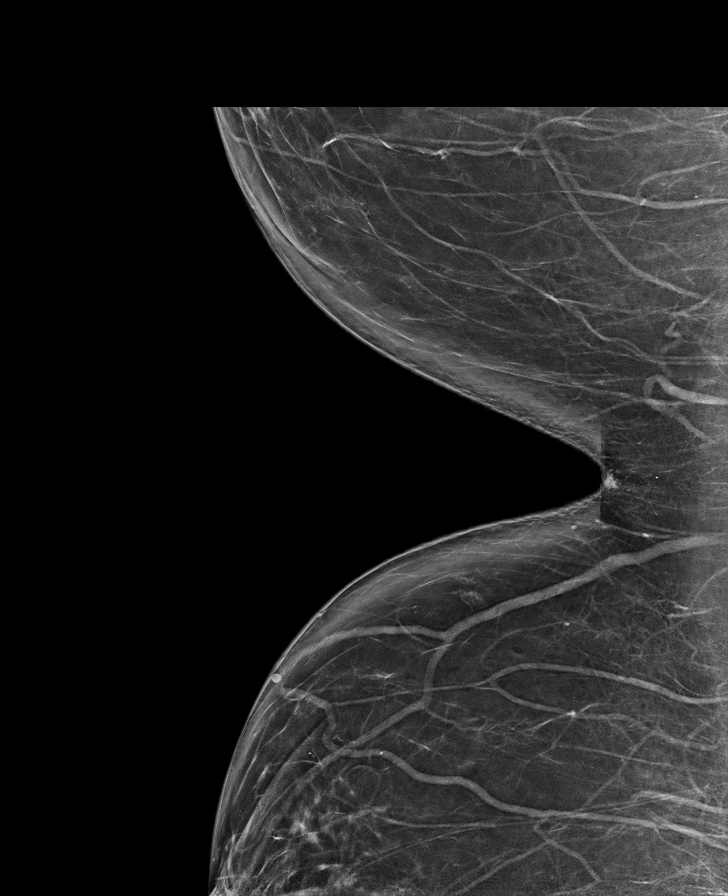

[L MLO synth-2D (2 of 2)]
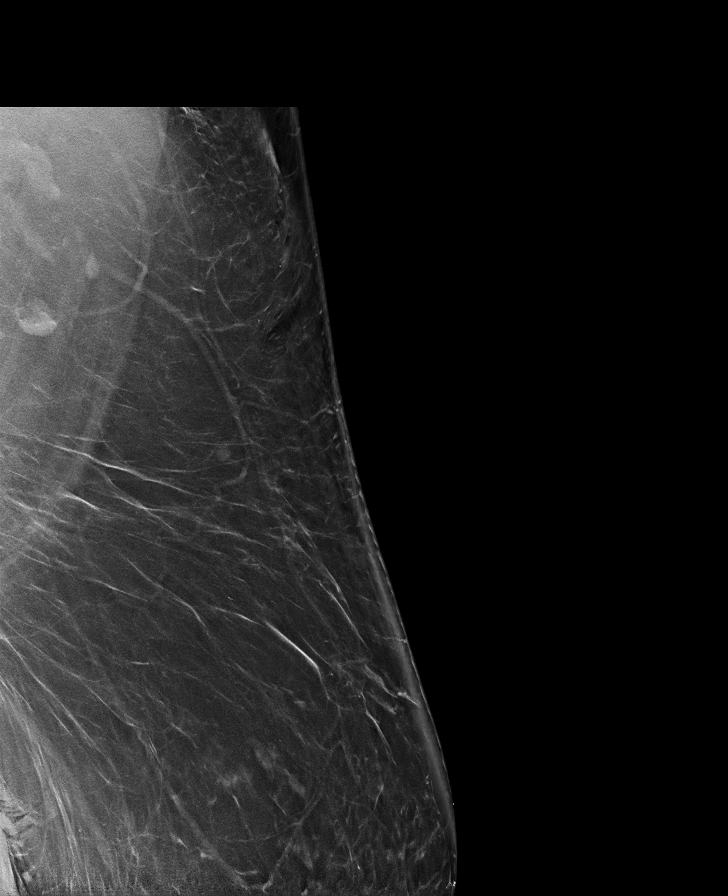

[R MLO tomo · tomo slice 63/92.0]
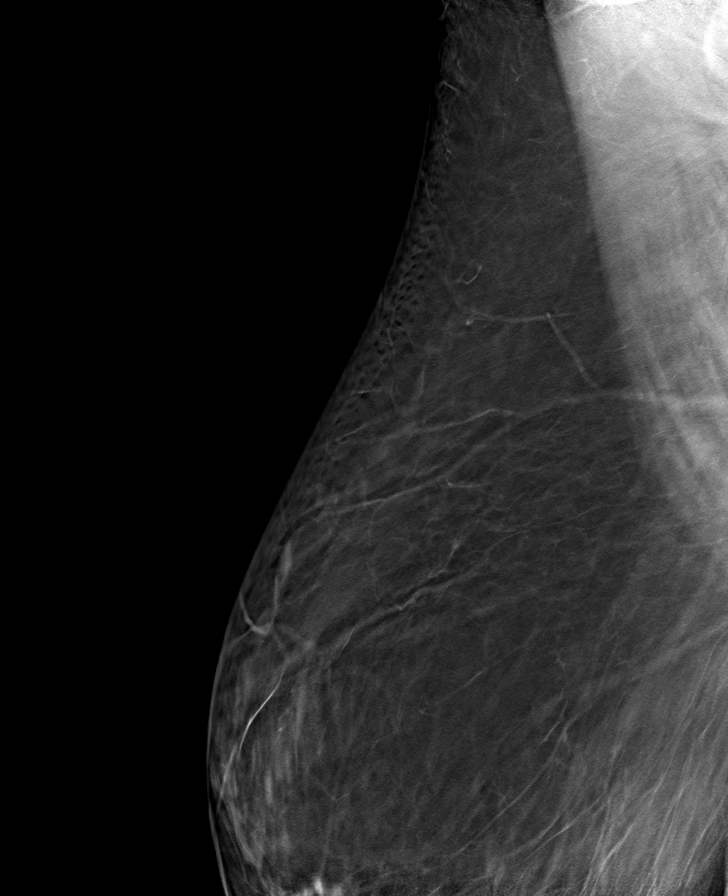

[8 of 40 positions shown; findings below may reference images not displayed]

ACR Breast Density Category b: There are scattered areas of
fibroglandular density.
FINDINGS: There are no findings suspicious for malignancy. Images were
processed with CAD.
IMPRESSION: No mammographic evidence of malignancy. A result letter of this
screening mammogram will be mailed directly to the patient.

RECOMMENDATION:
Screening mammogram in one year. (Code:[id number])

BI-RADS CATEGORY  1: Negative.

## 2019-04-17 ENCOUNTER — Ambulatory Visit: Payer: BC Managed Care – PPO | Attending: Internal Medicine

## 2019-04-17 DIAGNOSIS — Z23 Encounter for immunization: Secondary | ICD-10-CM

## 2019-04-17 NOTE — Progress Notes (Signed)
   Covid-19 Vaccination Clinic  Name:  Olivia Moreno    MRN: [id number] DOB: December 04, 1965  04/17/2019  [name] was observed post Covid-19 immunization for 15 minutes without incidence. She was provided with Vaccine Information Sheet and instruction to access the V-Safe system.   [name] was instructed to call 911 with any severe reactions post vaccine: Marland Kitchen Difficulty breathing  . Swelling of your face and throat  . A fast heartbeat  . A bad rash all over your body  . Dizziness and weakness    Immunizations Administered    Name Date Dose VIS Date Route   Pfizer COVID-19 Vaccine 04/17/2019  5:55 PM 0.3 mL 01/29/2019 Intramuscular   Manufacturer: [employer], [employer]   Lot: [id number]   NDC: [address]-6

## 2019-05-08 ENCOUNTER — Other Ambulatory Visit: Payer: Self-pay

## 2019-05-08 ENCOUNTER — Ambulatory Visit: Payer: BC Managed Care – PPO | Attending: Internal Medicine

## 2019-05-08 DIAGNOSIS — Z23 Encounter for immunization: Secondary | ICD-10-CM

## 2019-05-08 NOTE — Progress Notes (Signed)
   Covid-19 Vaccination Clinic  Name:  Olivia Moreno    MRN: [id number] DOB: 1965/05/22  05/08/2019  [name] was observed post Covid-19 immunization for 15 minutes without incident. She was provided with Vaccine Information Sheet and instruction to access the V-Safe system.   [name] was instructed to call 911 with any severe reactions post vaccine: Marland Kitchen Difficulty breathing  . Swelling of face and throat  . A fast heartbeat  . A bad rash all over body  . Dizziness and weakness   Immunizations Administered    Name Date Dose VIS Date Route   Pfizer COVID-19 Vaccine 05/08/2019  1:50 PM 0.3 mL 01/29/2019 Intramuscular   Manufacturer: [employer], [employer]   Lot: [id number]   NDC: [address]-6

## 2019-05-17 ENCOUNTER — Other Ambulatory Visit: Payer: Self-pay | Admitting: Primary Care

## 2019-05-17 DIAGNOSIS — I10 Essential (primary) hypertension: Secondary | ICD-10-CM

## 2019-06-12 ENCOUNTER — Other Ambulatory Visit: Payer: Self-pay | Admitting: Primary Care

## 2019-06-12 DIAGNOSIS — I10 Essential (primary) hypertension: Secondary | ICD-10-CM

## 2019-06-18 ENCOUNTER — Encounter: Payer: Self-pay | Admitting: Primary Care

## 2019-06-18 ENCOUNTER — Other Ambulatory Visit: Payer: Self-pay

## 2019-06-18 ENCOUNTER — Ambulatory Visit: Payer: BC Managed Care – PPO | Admitting: Primary Care

## 2019-06-18 ENCOUNTER — Other Ambulatory Visit: Payer: Self-pay | Admitting: Primary Care

## 2019-06-18 VITALS — BP 148/100 | HR 82 | Temp 95.8°F | Ht 63.0 in | Wt 253.5 lb

## 2019-06-18 DIAGNOSIS — E7800 Pure hypercholesterolemia, unspecified: Secondary | ICD-10-CM

## 2019-06-18 DIAGNOSIS — E119 Type 2 diabetes mellitus without complications: Secondary | ICD-10-CM | POA: Diagnosis not present

## 2019-06-18 DIAGNOSIS — E782 Mixed hyperlipidemia: Secondary | ICD-10-CM

## 2019-06-18 DIAGNOSIS — M1A9XX0 Chronic gout, unspecified, without tophus (tophi): Secondary | ICD-10-CM

## 2019-06-18 DIAGNOSIS — R609 Edema, unspecified: Secondary | ICD-10-CM

## 2019-06-18 DIAGNOSIS — I10 Essential (primary) hypertension: Secondary | ICD-10-CM | POA: Diagnosis not present

## 2019-06-18 DIAGNOSIS — N289 Disorder of kidney and ureter, unspecified: Secondary | ICD-10-CM

## 2019-06-18 LAB — COMPREHENSIVE METABOLIC PANEL
ALT: 26 U/L (ref 0–35)
AST: 29 U/L (ref 0–37)
Albumin: 4.4 g/dL (ref 3.5–5.2)
Alkaline Phosphatase: 108 U/L (ref 39–117)
BUN: 19 mg/dL (ref 6–23)
CO2: 26 mEq/L (ref 19–32)
Calcium: 9.6 mg/dL (ref 8.4–10.5)
Chloride: 105 mEq/L (ref 96–112)
Creatinine, Ser: 1.03 mg/dL (ref 0.40–1.20)
GFR: 55.7 mL/min — ABNORMAL LOW (ref >60.00)
Glucose, Bld: 210 mg/dL — ABNORMAL HIGH (ref 70–99)
Potassium: 4.7 mEq/L (ref 3.5–5.1)
Sodium: 140 mEq/L (ref 135–145)
Total Bilirubin: 0.8 mg/dL (ref 0.2–1.2)
Total Protein: 7.3 g/dL (ref 6.0–8.3)

## 2019-06-18 LAB — HEMOGLOBIN A1C: Hgb A1c MFr Bld: 8.1 % — ABNORMAL HIGH (ref 4.6–6.5)

## 2019-06-18 LAB — CBC
HCT: 39.6 % (ref 36.0–46.0)
Hemoglobin: 13.1 g/dL (ref 12.0–15.0)
MCHC: 33.1 g/dL (ref 30.0–36.0)
MCV: 93.1 fl (ref 78.0–100.0)
Platelets: 137 10*3/uL — ABNORMAL LOW (ref 150.0–400.0)
RBC: 4.25 Mil/uL (ref 3.87–5.11)
RDW: 14.6 % (ref 11.5–15.5)
WBC: 5.4 10*3/uL (ref 4.0–10.5)

## 2019-06-18 LAB — LIPID PANEL
Cholesterol: 141 mg/dL (ref 0–200)
HDL: 44.6 mg/dL (ref >39.00)
LDL Cholesterol: 62 mg/dL (ref 0–99)
NonHDL: 96.55
Total CHOL/HDL Ratio: 3
Triglycerides: 174 mg/dL — ABNORMAL HIGH (ref 0.0–149.0)
VLDL: 34.8 mg/dL (ref 0.0–40.0)

## 2019-06-18 LAB — URIC ACID: Uric Acid, Serum: 6.3 mg/dL (ref 2.4–7.0)

## 2019-06-18 MED ORDER — CHLORTHALIDONE 25 MG PO TABS: 25.0000 mg | ORAL_TABLET | Freq: Every day | ORAL | 0 refills | Status: DC

## 2019-06-18 MED ORDER — GLIPIZIDE ER 5 MG PO TB24: 5.0000 mg | ORAL_TABLET | Freq: Every day | ORAL | 3 refills | Status: DC

## 2019-06-18 MED ORDER — ROSUVASTATIN CALCIUM 5 MG PO TABS: 5.0000 mg | ORAL_TABLET | Freq: Every day | ORAL | 3 refills | Status: DC

## 2019-06-18 MED ORDER — METFORMIN HCL 500 MG PO TABS: 500.0000 mg | ORAL_TABLET | Freq: Two times a day (BID) | ORAL | 0 refills | Status: DC

## 2019-06-18 MED ORDER — LOSARTAN POTASSIUM 100 MG PO TABS: 100.0000 mg | ORAL_TABLET | Freq: Every day | ORAL | 3 refills | Status: DC

## 2019-06-18 NOTE — Assessment & Plan Note (Signed)
Uncontrolled and above goal today on losartan 100 mg alone. Could not tolerate amlodipine 5 due to ankle edema, could not tolerate HCTZ due to gout.  Try low dose chlorthalidone to see if she can tolerate. If not then may need to consider carvedilol or hydralazine.  Repeat renal function pending today. Follow up in 2 weeks.

## 2019-06-18 NOTE — Assessment & Plan Note (Signed)
No flares in one year, no recent use of allopurinol.  BP is uncontrolled on max dose of losartan. Cannot tolerate HCTZ or amlodipine.  Will trial low dose chlorthalidone to see if this helps with BP control and does not trigger gout. Will closely monitor. Follow up in 2 weeks.

## 2019-06-18 NOTE — Assessment & Plan Note (Signed)
Resolved since off amlodipine.

## 2019-06-18 NOTE — Progress Notes (Signed)
Subjective:    Patient ID: Olivia Moreno, female    DOB: [birth year], 54 y.o.   MRN: [id number]  HPI  This visit occurred during the SARS-CoV-2 public health emergency.  Safety protocols were in place, including screening questions prior to the visit, additional usage of staff PPE, and extensive cleaning of exam room while observing appropriate contact time as indicated for disinfecting solutions.   [name] is a 54 year old female with a history of hypertension, OSA, type 2 diabetes, hyperlipidemia, peripheral edema, decreased renal function who presents today for follow up. She has not been evaluated in over one year.   1) Essential Hypertension: Currently prescribed amlodipine 5 mg, losartan 100 mg. She stopped taking her amlodipine about one year ago due to ankle edema, this resolved after she stopped amlodipine. She was once managed on HCTZ but this triggered gout flares.   She denies chest pain, dizziness, visual changes.   2) Type 2 Diabetes:   Current medications include: Metformin 500 mg BID.  She is checking her blood glucose occasionally and is getting readings of below 120.  Last A1C: 6.1 in February 2020 Last Eye Exam: Due in June 2021 Last Foot Exam: Due today Pneumonia Vaccination: Completed in 2019 ACE/ARB: Losartan Statin: Crestor  3) Chronic Gout: Previously managed on allopurinol daily for gout. Was once managed on HCTZ which caused gout symptoms. No gout flare in one year. Has not taken allopurinol for one year.   BP Readings from Last 3 Encounters:  06/18/19 (!) 148/100  04/17/18 (!) 146/76  03/13/18 (!) 144/84     Review of Systems  Eyes: Negative for visual disturbance.  Respiratory: Negative for shortness of breath.   Cardiovascular: Negative for chest pain.  Musculoskeletal: Negative for arthralgias and joint swelling.  Neurological: Negative for dizziness and headaches.       Past Medical History:  Diagnosis Date  . Gout   .  Hyperlipidemia   . Hypertension      Social History   Socioeconomic History  . Marital status: Married    Spouse name: Not on file  . Number of children: 1  . Years of education: Not on file  . Highest education level: Not on file  Occupational History  . Occupation: unemployed    Associate Professor: UNEMPLOYED  Tobacco Use  . Smoking status: Never Smoker  . Smokeless tobacco: Never Used  Substance and Sexual Activity  . Alcohol use: No  . Drug use: No  . Sexual activity: Not on file  Other Topics Concern  . Not on file  Social History Narrative   Married.   1 child, 1 grandson.   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    Social Determinants of Health   Financial Resource Strain:   . Difficulty of Paying Living Expenses:   Food Insecurity:   . Worried About Programme researcher, broadcasting/film/video in the Last Year:   . Barista in the Last Year:   Transportation Needs:   . Freight forwarder (Medical):   Marland Kitchen Lack of Transportation (Non-Medical):   Physical Activity:   . Days of Exercise per Week:   . Minutes of Exercise per Session:   Stress:   . Feeling of Stress :   Social Connections:   . Frequency of Communication with Friends and Family:   . Frequency of Social Gatherings with Friends and Family:   . Attends Religious Services:   .  Active Member of Clubs or Organizations:   . Attends Archivist Meetings:   Marland Kitchen Marital Status:   Intimate Partner Violence:   . Fear of Current or Ex-Partner:   . Emotionally Abused:   Marland Kitchen Physically Abused:   . Sexually Abused:     No past surgical history on file.  Family History  Problem Relation Age of Onset  . Heart attack Mother   . Pulmonary embolism Father   . Pulmonary embolism Brother   . Breast cancer Neg Hx     Allergies  Allergen Reactions  . Penicillins Rash    Reaction as a child    Current Outpatient Medications on File Prior to Visit  Medication Sig Dispense Refill  .  cetirizine (ZYRTEC) 10 MG tablet Take by mouth.    Marland Kitchen glucose blood test strip Use three times daily as directed. 100 each 3  . losartan (COZAAR) 100 MG tablet TAKE 1 TABLET (100 MG TOTAL) BY MOUTH DAILY. NEED APPOINTMENT FOR ANY MORE REFILLS 30 tablet 0  . metFORMIN (GLUCOPHAGE) 500 MG tablet TAKE 1 TABLET BY MOUTH TWICE A DAY WITH FOOD 180 tablet 0  . Naftifine HCl (NAFTIN) 2 % GEL Apply to affected area twice daily. 60 g 3  . rosuvastatin (CRESTOR) 5 MG tablet Take 1 tablet (5 mg total) by mouth daily. NEEDS OFFICE VISIT 90 tablet 0   No current facility-administered medications on file prior to visit.    BP (!) 148/100   Pulse 82   Temp (!) 95.8 F (35.4 C) (Temporal)   Ht 5\' 3"  (1.6 m)   Wt 253 lb 8 oz (115 kg)   LMP 05/20/2010   SpO2 98%   BMI 44.91 kg/m    Objective:   Physical Exam  Constitutional: She appears well-nourished.  Cardiovascular: Normal rate and regular rhythm.  Respiratory: Effort normal and breath sounds normal.  Musculoskeletal:     Cervical back: Neck supple.  Skin: Skin is warm and dry.  Psychiatric: She has a normal mood and affect.           Assessment & Plan:

## 2019-06-18 NOTE — Assessment & Plan Note (Signed)
Likely secondary to uncontrolled hypertension. Repeat renal function pending.

## 2019-06-18 NOTE — Patient Instructions (Addendum)
Start chlorthalidone 25 mg tablets for blood pressure. Continue losartan 100 mg for blood pressure.  Stop by the lab prior to leaving today. I will notify you of your results once received.   Schedule a follow up visit for 2 weeks for blood pressure check.  It was a pleasure to see you today!   DASH Eating Plan DASH stands for "Dietary Approaches to Stop Hypertension." The DASH eating plan is a healthy eating plan that has been shown to reduce high blood pressure (hypertension). It may also reduce your risk for type 2 diabetes, heart disease, and stroke. The DASH eating plan may also help with weight loss. What are tips for following this plan?  General guidelines  Avoid eating more than 2,300 mg (milligrams) of salt (sodium) a day. If you have hypertension, you may need to reduce your sodium intake to 1,500 mg a day.  Limit alcohol intake to no more than 1 drink a day for nonpregnant women and 2 drinks a day for men. One drink equals 12 oz of beer, 5 oz of wine, or 1 oz of hard liquor.  Work with your health care provider to maintain a healthy body weight or to lose weight. Ask what an ideal weight is for you.  Get at least 30 minutes of exercise that causes your heart to beat faster (aerobic exercise) most days of the week. Activities may include walking, swimming, or biking.  Work with your health care provider or diet and nutrition specialist (dietitian) to adjust your eating plan to your individual calorie needs. Reading food labels   Check food labels for the amount of sodium per serving. Choose foods with less than 5 percent of the Daily Value of sodium. Generally, foods with less than 300 mg of sodium per serving fit into this eating plan.  To find whole grains, look for the word "whole" as the first word in the ingredient list. Shopping  Buy products labeled as "low-sodium" or "no salt added."  Buy fresh foods. Avoid canned foods and premade or frozen  meals. Cooking  Avoid adding salt when cooking. Use salt-free seasonings or herbs instead of table salt or sea salt. Check with your health care provider or pharmacist before using salt substitutes.  Do not fry foods. Cook foods using healthy methods such as baking, boiling, grilling, and broiling instead.  Cook with heart-healthy oils, such as olive, canola, soybean, or sunflower oil. Meal planning  Eat a balanced diet that includes: ? 5 or more servings of fruits and vegetables each day. At each meal, try to fill half of your plate with fruits and vegetables. ? Up to 6-8 servings of whole grains each day. ? Less than 6 oz of lean meat, poultry, or fish each day. A 3-oz serving of meat is about the same size as a deck of cards. One egg equals 1 oz. ? 2 servings of low-fat dairy each day. ? A serving of nuts, seeds, or beans 5 times each week. ? Heart-healthy fats. Healthy fats called Omega-3 fatty acids are found in foods such as flaxseeds and coldwater fish, like sardines, salmon, and mackerel.  Limit how much you eat of the following: ? Canned or prepackaged foods. ? Food that is high in trans fat, such as fried foods. ? Food that is high in saturated fat, such as fatty meat. ? Sweets, desserts, sugary drinks, and other foods with added sugar. ? Full-fat dairy products.  Do not salt foods before eating.  Try to  eat at least 2 vegetarian meals each week.  Eat more home-cooked food and less restaurant, buffet, and fast food.  When eating at a restaurant, ask that your food be prepared with less salt or no salt, if possible. What foods are recommended? The items listed may not be a complete list. Talk with your dietitian about what dietary choices are best for you. Grains Whole-grain or whole-wheat bread. Whole-grain or whole-wheat pasta. Brown rice. [place]. Bulgur. Whole-grain and low-sodium cereals. Pita bread. Low-fat, low-sodium crackers. Whole-wheat flour  tortillas. Vegetables Fresh or frozen vegetables (raw, steamed, roasted, or grilled). Low-sodium or reduced-sodium tomato and vegetable juice. Low-sodium or reduced-sodium tomato sauce and tomato paste. Low-sodium or reduced-sodium canned vegetables. Fruits All fresh, dried, or frozen fruit. Canned fruit in natural juice (without added sugar). Meat and other protein foods Skinless chicken or [place]. Ground chicken or [place]. Pork with fat trimmed off. Fish and seafood. Egg whites. Dried beans, peas, or lentils. Unsalted nuts, nut butters, and seeds. Unsalted canned beans. Lean cuts of beef with fat trimmed off. Low-sodium, lean deli meat. Dairy Low-fat (1%) or fat-free (skim) milk. Fat-free, low-fat, or reduced-fat cheeses. Nonfat, low-sodium ricotta or cottage cheese. Low-fat or nonfat yogurt. Low-fat, low-sodium cheese. Fats and oils Soft margarine without trans fats. Vegetable oil. Low-fat, reduced-fat, or light mayonnaise and salad dressings (reduced-sodium). Canola, safflower, olive, soybean, and sunflower oils. Avocado. Seasoning and other foods Herbs. Spices. Seasoning mixes without salt. Unsalted popcorn and pretzels. Fat-free sweets. What foods are not recommended? The items listed may not be a complete list. Talk with your dietitian about what dietary choices are best for you. Grains Baked goods made with fat, such as croissants, muffins, or some breads. Dry pasta or rice meal packs. Vegetables Creamed or fried vegetables. Vegetables in a cheese sauce. Regular canned vegetables (not low-sodium or reduced-sodium). Regular canned tomato sauce and paste (not low-sodium or reduced-sodium). Regular tomato and vegetable juice (not low-sodium or reduced-sodium). Angie Fava. Olives. Fruits Canned fruit in a light or heavy syrup. Fried fruit. Fruit in cream or butter sauce. Meat and other protein foods Fatty cuts of meat. Ribs. Fried meat. [name]. Sausage. Bologna and other processed lunch meats.  Salami. Fatback. Hotdogs. Bratwurst. Salted nuts and seeds. Canned beans with added salt. Canned or smoked fish. Whole eggs or egg yolks. Chicken or [place] with skin. Dairy Whole or 2% milk, cream, and half-and-half. Whole or full-fat cream cheese. Whole-fat or sweetened yogurt. Full-fat cheese. Nondairy creamers. Whipped toppings. Processed cheese and cheese spreads. Fats and oils Butter. Stick margarine. Lard. Shortening. Ghee. Bacon fat. Tropical oils, such as coconut, palm kernel, or palm oil. Seasoning and other foods Salted popcorn and pretzels. Onion salt, garlic salt, seasoned salt, table salt, and sea salt. Worcestershire sauce. Tartar sauce. Barbecue sauce. Teriyaki sauce. Soy sauce, including reduced-sodium. Steak sauce. Canned and packaged gravies. Fish sauce. Oyster sauce. Cocktail sauce. Horseradish that you find on the shelf. Ketchup. Mustard. Meat flavorings and tenderizers. Bouillon cubes. Hot sauce and Tabasco sauce. Premade or packaged marinades. Premade or packaged taco seasonings. Relishes. Regular salad dressings. Where to find more information:  National Heart, Lung, and [place]: https://Sheehan-eaton.com/  American Heart Association: www.heart.org Summary  The DASH eating plan is a healthy eating plan that has been shown to reduce high blood pressure (hypertension). It may also reduce your risk for type 2 diabetes, heart disease, and stroke.  With the DASH eating plan, you should limit salt (sodium) intake to 2,300 mg a day. If you have  hypertension, you may need to reduce your sodium intake to 1,500 mg a day.  When on the DASH eating plan, aim to eat more fresh fruits and vegetables, whole grains, lean proteins, low-fat dairy, and heart-healthy fats.  Work with your health care provider or diet and nutrition specialist (dietitian) to adjust your eating plan to your individual calorie needs. This information is not intended to replace advice given to you by your health  care provider. Make sure you discuss any questions you have with your health care provider. Document Revised: 01/17/2017 Document Reviewed: 01/29/2016 Elsevier Patient Education  2020 [employer].

## 2019-06-18 NOTE — Assessment & Plan Note (Signed)
Compliant to rosuvastatin, repeat lipid panel pending. 

## 2019-06-18 NOTE — Assessment & Plan Note (Signed)
Compliant to Metformin. Glucose readings seem at goal.  Managed on ARB and statin. Pneumonia vaccination UTD. Repeat A1C pending. Foot exam today. Eye exam scheduled for June 2021.  Follow up in 6 months.

## 2019-07-02 ENCOUNTER — Other Ambulatory Visit: Payer: Self-pay

## 2019-07-02 ENCOUNTER — Ambulatory Visit (INDEPENDENT_AMBULATORY_CARE_PROVIDER_SITE_OTHER): Payer: BC Managed Care – PPO | Admitting: Primary Care

## 2019-07-02 VITALS — BP 128/84 | HR 78 | Temp 96.3°F | Ht 63.0 in | Wt 249.2 lb

## 2019-07-02 DIAGNOSIS — I10 Essential (primary) hypertension: Secondary | ICD-10-CM

## 2019-07-02 LAB — BASIC METABOLIC PANEL
BUN: 27 mg/dL — ABNORMAL HIGH (ref 6–23)
CO2: 26 mEq/L (ref 19–32)
Calcium: 9.6 mg/dL (ref 8.4–10.5)
Chloride: 106 mEq/L (ref 96–112)
Creatinine, Ser: 1.12 mg/dL (ref 0.40–1.20)
GFR: 50.56 mL/min — ABNORMAL LOW (ref >60.00)
Glucose, Bld: 164 mg/dL — ABNORMAL HIGH (ref 70–99)
Potassium: 4.4 mEq/L (ref 3.5–5.1)
Sodium: 140 mEq/L (ref 135–145)

## 2019-07-02 NOTE — Assessment & Plan Note (Signed)
Improved and at goal on chlorthalidone 25 mg and losartan 100 mg. Repeat BMP pending. If renal function is stable then we will send refills for chlorthalidone.

## 2019-07-02 NOTE — Patient Instructions (Addendum)
Stop by the lab prior to leaving today. I will notify you of your results once received.   Try levocetirizine (Xyzal) 5 mg once daily for allergies.   Schedule a follow up visit within 4-6 months for diabetes check.  It was a pleasure to see you today!

## 2019-07-02 NOTE — Progress Notes (Signed)
Subjective:    Patient ID: Olivia Moreno, female    DOB: 05-Sep-1965, 54 y.o.   MRN: [id number]  HPI  This visit occurred during the SARS-CoV-2 public health emergency.  Safety protocols were in place, including screening questions prior to the visit, additional usage of staff PPE, and extensive cleaning of exam room while observing appropriate contact time as indicated for disinfecting solutions.   Ms. Buren is a 54 year old female with a history of decreased renal function, hypertension, hyperlipidemia, gout, type 2 diabetes who presents today for follow up of hypertension.  She was last evaluated on 06/18/19, blood pressure was uncontrolled on losartan 100 mg alone. She's unable to tolerate amlodipine 5 mg due to ankle edema, could not tolerate HCTZ due to gout. Given limited options we decided to trial a low dose of chlorthalidone and have her follow up today.  Since her last visit she's feeling better. She is checking her BP at home which is running 120's-130's/80's. She denies gout symptoms, chest pain, shortness of breath, lower extremity edema.   BP Readings from Last 3 Encounters:  07/02/19 128/84  06/18/19 (!) 148/100  04/17/18 (!) 146/76     Wt Readings from Last 3 Encounters:  07/02/19 249 lb 4 oz (113.1 kg)  06/18/19 253 lb 8 oz (115 kg)  04/17/18 230 lb 8 oz (104.6 kg)     Review of Systems  Eyes: Negative for visual disturbance.  Respiratory: Negative for shortness of breath.   Cardiovascular: Negative for chest pain.  Neurological: Negative for dizziness and headaches.       Past Medical History:  Diagnosis Date  . Gout   . Hyperlipidemia   . Hypertension      Social History   Socioeconomic History  . Marital status: Married    Spouse name: Not on file  . Number of children: 1  . Years of education: Not on file  . Highest education level: Not on file  Occupational History  . Occupation: unemployed    Associate Professor: UNEMPLOYED  Tobacco Use  .  Smoking status: Never Smoker  . Smokeless tobacco: Never Used  Substance and Sexual Activity  . Alcohol use: No  . Drug use: No  . Sexual activity: Not on file  Other Topics Concern  . Not on file  Social History Narrative   Married.   1 child, 1 grandson.   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    Social Determinants of Health   Financial Resource Strain:   . Difficulty of Paying Living Expenses:   Food Insecurity:   . Worried About Programme researcher, broadcasting/film/video in the Last Year:   . Barista in the Last Year:   Transportation Needs:   . Freight forwarder (Medical):   Marland Kitchen Lack of Transportation (Non-Medical):   Physical Activity:   . Days of Exercise per Week:   . Minutes of Exercise per Session:   Stress:   . Feeling of Stress :   Social Connections:   . Frequency of Communication with Friends and Family:   . Frequency of Social Gatherings with Friends and Family:   . Attends Religious Services:   . Active Member of Clubs or Organizations:   . Attends Banker Meetings:   Marland Kitchen Marital Status:   Intimate Partner Violence:   . Fear of Current or Ex-Partner:   . Emotionally Abused:   Marland Kitchen Physically Abused:   .  Sexually Abused:     No past surgical history on file.  Family History  Problem Relation Age of Onset  . Heart attack Mother   . Pulmonary embolism Father   . Pulmonary embolism Brother   . Breast cancer Neg Hx     Allergies  Allergen Reactions  . Penicillins Rash    Reaction as a child    Current Outpatient Medications on File Prior to Visit  Medication Sig Dispense Refill  . cetirizine (ZYRTEC) 10 MG tablet Take by mouth.    . chlorthalidone (HYGROTON) 25 MG tablet Take 1 tablet (25 mg total) by mouth daily. For blood pressure. 30 tablet 0  . glipiZIDE (GLIPIZIDE XL) 5 MG 24 hr tablet Take 1 tablet (5 mg total) by mouth daily with breakfast. For diabetes. 90 tablet 3  . glucose blood test strip Use  three times daily as directed. 100 each 3  . losartan (COZAAR) 100 MG tablet Take 1 tablet (100 mg total) by mouth daily. Blood pressure. 90 tablet 3  . metFORMIN (GLUCOPHAGE) 500 MG tablet Take 1 tablet (500 mg total) by mouth 2 (two) times daily with a meal. For diabetes. 180 tablet 0  . Naftifine HCl (NAFTIN) 2 % GEL Apply to affected area twice daily. 60 g 3  . rosuvastatin (CRESTOR) 5 MG tablet Take 1 tablet (5 mg total) by mouth daily. For cholesterol. 90 tablet 3   No current facility-administered medications on file prior to visit.    BP 128/84   Pulse 78   Temp (!) 96.3 F (35.7 C) (Temporal)   Ht 5\' 3"  (1.6 m)   Wt 249 lb 4 oz (113.1 kg)   LMP 05/20/2010   SpO2 100%   BMI 44.15 kg/m    Objective:   Physical Exam  Constitutional: She appears well-nourished.  Cardiovascular: Normal rate and regular rhythm.  Respiratory: Effort normal and breath sounds normal.  Musculoskeletal:     Cervical back: Neck supple.  Skin: Skin is warm and dry.  Psychiatric: She has a normal mood and affect.           Assessment & Plan:

## 2019-07-10 ENCOUNTER — Other Ambulatory Visit: Payer: Self-pay | Admitting: Primary Care

## 2019-07-10 DIAGNOSIS — I10 Essential (primary) hypertension: Secondary | ICD-10-CM

## 2019-08-13 LAB — HM DIABETES EYE EXAM

## 2019-08-18 ENCOUNTER — Encounter: Payer: Self-pay | Admitting: Primary Care

## 2019-10-16 ENCOUNTER — Other Ambulatory Visit: Payer: Self-pay | Admitting: Primary Care

## 2019-10-16 DIAGNOSIS — E119 Type 2 diabetes mellitus without complications: Secondary | ICD-10-CM

## 2019-12-09 ENCOUNTER — Other Ambulatory Visit: Payer: Self-pay | Admitting: Primary Care

## 2019-12-09 DIAGNOSIS — Z1231 Encounter for screening mammogram for malignant neoplasm of breast: Secondary | ICD-10-CM

## 2019-12-12 ENCOUNTER — Other Ambulatory Visit: Payer: Self-pay | Admitting: Primary Care

## 2019-12-12 DIAGNOSIS — E119 Type 2 diabetes mellitus without complications: Secondary | ICD-10-CM

## 2019-12-14 NOTE — Telephone Encounter (Signed)
Glipizide changed to 5 mg twice daily from XL version.

## 2019-12-14 NOTE — Telephone Encounter (Signed)
See note from pharmacy.

## 2019-12-30 ENCOUNTER — Ambulatory Visit: Payer: BC Managed Care – PPO | Admitting: Primary Care

## 2019-12-30 ENCOUNTER — Other Ambulatory Visit: Payer: Self-pay

## 2019-12-30 ENCOUNTER — Encounter: Payer: Self-pay | Admitting: Primary Care

## 2019-12-30 VITALS — BP 120/82 | HR 100 | Temp 98.4°F | Ht 63.0 in | Wt 246.0 lb

## 2019-12-30 DIAGNOSIS — E119 Type 2 diabetes mellitus without complications: Secondary | ICD-10-CM | POA: Diagnosis not present

## 2019-12-30 LAB — POCT GLYCOSYLATED HEMOGLOBIN (HGB A1C): Hemoglobin A1C: 6.6 % — AB (ref 4.0–5.6)

## 2019-12-30 MED ORDER — FREESTYLE LIBRE 14 DAY READER DEVI: 0 refills | Status: DC

## 2019-12-30 MED ORDER — FREESTYLE LIBRE 14 DAY SENSOR MISC: 1 refills | Status: DC

## 2019-12-30 NOTE — Progress Notes (Signed)
Subjective:    Patient ID: Olivia Moreno, female    DOB: 11-Jun-1965, 54 y.o.   MRN: [id number]  HPI  This visit occurred during the SARS-CoV-2 public health emergency.  Safety protocols were in place, including screening questions prior to the visit, additional usage of staff PPE, and extensive cleaning of exam room while observing appropriate contact time as indicated for disinfecting solutions.   Olivia Moreno is a 54 year old female with a history of hypertension, type 2 diabetes, hyperlipidemia, morbid obesity who presents today for follow up of diabetes.  Current medications include: Glipizide 5 mg BID, Metformin 500 mg BID.   She is checking her blood glucose on occasion is getting readings of 120's-140's.  Last A1C: 8.1 in April 2021,  Last [place] Exam: Completed in May 2021 Last Foot Exam: UTD Pneumonia Vaccination: Completed in 2019 ACE/ARB: Losartan  Statin: Crestor   BP Readings from Last 3 Encounters:  12/30/19 120/82  07/02/19 128/84  06/18/19 (!) 148/100     Review of Systems  Respiratory: Negative for shortness of breath.   Cardiovascular: Negative for chest pain.  Neurological: Negative for dizziness and numbness.       Past Medical History:  Diagnosis Date  . Gout   . Hyperlipidemia   . Hypertension      Social History   Socioeconomic History  . Marital status: Married    Spouse name: Not on file  . Number of children: 1  . Years of education: Not on file  . Highest education level: Not on file  Occupational History  . Occupation: unemployed    Associate Professor: UNEMPLOYED  Tobacco Use  . Smoking status: Never Smoker  . Smokeless tobacco: Never Used  Substance and Sexual Activity  . Alcohol use: No  . Drug use: No  . Sexual activity: Not on file  Other Topics Concern  . Not on file  Social History Narrative   Married.   1 child, 1 grandson.   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    Social Determinants  of Health   Financial Resource Strain:   . Difficulty of Paying Living Expenses: Not on file  Food Insecurity:   . Worried About Programme researcher, broadcasting/film/video in the Last Year: Not on file  . Ran Out of Food in the Last Year: Not on file  Transportation Needs:   . Lack of Transportation (Medical): Not on file  . Lack of Transportation (Non-Medical): Not on file  Physical Activity:   . Days of Exercise per Week: Not on file  . Minutes of Exercise per Session: Not on file  Stress:   . Feeling of Stress : Not on file  Social Connections:   . Frequency of Communication with Friends and Family: Not on file  . Frequency of Social Gatherings with Friends and Family: Not on file  . Attends Religious Services: Not on file  . Active Member of Clubs or Organizations: Not on file  . Attends Banker Meetings: Not on file  . Marital Status: Not on file  Intimate Partner Violence:   . Fear of Current or Ex-Partner: Not on file  . Emotionally Abused: Not on file  . Physically Abused: Not on file  . Sexually Abused: Not on file    History reviewed. No pertinent surgical history.  Family History  Problem Relation Age of Onset  . Heart attack Mother   . Pulmonary embolism  Father   . Pulmonary embolism Brother   . Breast cancer Neg Hx     Allergies  Allergen Reactions  . Penicillins Rash    Reaction as a child    Current Outpatient Medications on File Prior to Visit  Medication Sig Dispense Refill  . cetirizine (ZYRTEC) 10 MG tablet Take by mouth.    . chlorthalidone (HYGROTON) 25 MG tablet TAKE 1 TABLET BY MOUTH EVERY DAY FOR BLOOD PRESSURE 90 tablet 1  . glipiZIDE (GLUCOTROL) 5 MG tablet Take 1 tablet (5 mg total) by mouth 2 (two) times daily before a meal. For diabetes. 180 tablet 3  . glucose blood test strip Use three times daily as directed. 100 each 3  . losartan (COZAAR) 100 MG tablet Take 1 tablet (100 mg total) by mouth daily. Blood pressure. 90 tablet 3  . metFORMIN  (GLUCOPHAGE) 500 MG tablet TAKE 1 TABLET (500 MG TOTAL) BY MOUTH 2 (TWO) TIMES DAILY WITH A MEAL. FOR DIABETES. 180 tablet 0  . Naftifine HCl (NAFTIN) 2 % GEL Apply to affected area twice daily. 60 g 3  . rosuvastatin (CRESTOR) 5 MG tablet Take 1 tablet (5 mg total) by mouth daily. For cholesterol. 90 tablet 3   No current facility-administered medications on file prior to visit.    BP 120/82   Pulse 100   Temp 98.4 F (36.9 C) (Temporal)   Ht 5\' 3"  (1.6 m)   Wt 246 lb (111.6 kg)   LMP 05/20/2010   SpO2 98%   BMI 43.58 kg/m    Objective:   Physical Exam Cardiovascular:     Rate and Rhythm: Normal rate and regular rhythm.  Pulmonary:     Effort: Pulmonary effort is normal.     Breath sounds: Normal breath sounds.  Musculoskeletal:     Cervical back: Neck supple.  Skin:    General: Skin is warm and dry.            Assessment & Plan:

## 2019-12-30 NOTE — Patient Instructions (Signed)
I sent the Freestyle Libre to the pharmacy as discussed.  Continue to work on a healthy diet.  Be sure to exercise regularly.   Continue Metformin and Glipizide twice daily for diabetes.  It was a pleasure to see you today!

## 2019-12-30 NOTE — Assessment & Plan Note (Signed)
Well controlled in the office today, improved A1C to 6.6!   Continue Metformin and Glipizide. Foot and eye exams UTD. Managed on statin and ARB. Pneumonia vaccination UTD.  Follow up in 6 months.

## 2020-01-04 ENCOUNTER — Ambulatory Visit
Admission: RE | Admit: 2020-01-04 | Discharge: 2020-01-04 | Disposition: A | Discharge status: Home / Self Care | Payer: BC Managed Care – PPO | Source: Ambulatory Visit | Attending: Primary Care | Admitting: Primary Care

## 2020-01-04 ENCOUNTER — Other Ambulatory Visit: Payer: Self-pay

## 2020-01-04 DIAGNOSIS — Z1231 Encounter for screening mammogram for malignant neoplasm of breast: Secondary | ICD-10-CM | POA: Diagnosis not present

## 2020-01-09 ENCOUNTER — Other Ambulatory Visit: Payer: Self-pay | Admitting: Primary Care

## 2020-01-09 DIAGNOSIS — E119 Type 2 diabetes mellitus without complications: Secondary | ICD-10-CM

## 2020-01-11 NOTE — Telephone Encounter (Signed)
Pharmacy requests refill on: Metformin HCL 500 mg   LAST REFILL: 10/16/2019 LAST OV: 12/30/2019 NEXT OV: Not Scheduled  PHARMACY: [facility] [phone] in [facility] [place], [place]   HgbA1C (12/30/2019): 6.6

## 2020-01-13 ENCOUNTER — Other Ambulatory Visit: Payer: Self-pay | Admitting: Primary Care

## 2020-01-13 DIAGNOSIS — I10 Essential (primary) hypertension: Secondary | ICD-10-CM

## 2020-04-03 ENCOUNTER — Encounter: Payer: BC Managed Care – PPO | Admitting: Primary Care

## 2020-04-10 ENCOUNTER — Encounter: Payer: BC Managed Care – PPO | Admitting: Primary Care

## 2020-04-12 ENCOUNTER — Ambulatory Visit: Payer: BC Managed Care – PPO | Admitting: Primary Care

## 2020-04-12 ENCOUNTER — Encounter: Payer: Self-pay | Admitting: Primary Care

## 2020-04-12 ENCOUNTER — Other Ambulatory Visit: Payer: Self-pay

## 2020-04-12 ENCOUNTER — Other Ambulatory Visit (HOSPITAL_COMMUNITY)
Admission: RE | Admit: 2020-04-12 | Discharge: 2020-04-12 | Disposition: A | Discharge status: Home / Self Care | Payer: BC Managed Care – PPO | Source: Ambulatory Visit | Attending: Primary Care | Admitting: Primary Care

## 2020-04-12 VITALS — BP 128/82 | HR 100 | Temp 97.1°F | Ht 63.0 in | Wt 246.0 lb

## 2020-04-12 DIAGNOSIS — E119 Type 2 diabetes mellitus without complications: Secondary | ICD-10-CM | POA: Diagnosis not present

## 2020-04-12 DIAGNOSIS — E782 Mixed hyperlipidemia: Secondary | ICD-10-CM

## 2020-04-12 DIAGNOSIS — I10 Essential (primary) hypertension: Secondary | ICD-10-CM | POA: Diagnosis not present

## 2020-04-12 DIAGNOSIS — Z114 Encounter for screening for human immunodeficiency virus [HIV]: Secondary | ICD-10-CM

## 2020-04-12 DIAGNOSIS — Z124 Encounter for screening for malignant neoplasm of cervix: Secondary | ICD-10-CM | POA: Diagnosis not present

## 2020-04-12 DIAGNOSIS — Z23 Encounter for immunization: Secondary | ICD-10-CM | POA: Diagnosis not present

## 2020-04-12 DIAGNOSIS — G8929 Other chronic pain: Secondary | ICD-10-CM

## 2020-04-12 DIAGNOSIS — Z0000 Encounter for general adult medical examination without abnormal findings: Secondary | ICD-10-CM | POA: Diagnosis not present

## 2020-04-12 DIAGNOSIS — Z1159 Encounter for screening for other viral diseases: Secondary | ICD-10-CM

## 2020-04-12 DIAGNOSIS — G4733 Obstructive sleep apnea (adult) (pediatric): Secondary | ICD-10-CM

## 2020-04-12 DIAGNOSIS — M1A9XX0 Chronic gout, unspecified, without tophus (tophi): Secondary | ICD-10-CM

## 2020-04-12 DIAGNOSIS — M79643 Pain in unspecified hand: Secondary | ICD-10-CM

## 2020-04-12 LAB — LIPID PANEL
Cholesterol: 139 mg/dL (ref 0–200)
HDL: 48.5 mg/dL (ref >39.00)
LDL Cholesterol: 58 mg/dL (ref 0–99)
NonHDL: 90.63
Total CHOL/HDL Ratio: 3
Triglycerides: 165 mg/dL — ABNORMAL HIGH (ref 0.0–149.0)
VLDL: 33 mg/dL (ref 0.0–40.0)

## 2020-04-12 LAB — COMPREHENSIVE METABOLIC PANEL
ALT: 25 U/L (ref 0–35)
AST: 27 U/L (ref 0–37)
Albumin: 4.4 g/dL (ref 3.5–5.2)
Alkaline Phosphatase: 75 U/L (ref 39–117)
BUN: 25 mg/dL — ABNORMAL HIGH (ref 6–23)
CO2: 26 mEq/L (ref 19–32)
Calcium: 10.1 mg/dL (ref 8.4–10.5)
Chloride: 102 mEq/L (ref 96–112)
Creatinine, Ser: 1.15 mg/dL (ref 0.40–1.20)
GFR: 53.64 mL/min — ABNORMAL LOW (ref >60.00)
Glucose, Bld: 201 mg/dL — ABNORMAL HIGH (ref 70–99)
Potassium: 3.9 mEq/L (ref 3.5–5.1)
Sodium: 138 mEq/L (ref 135–145)
Total Bilirubin: 0.9 mg/dL (ref 0.2–1.2)
Total Protein: 7.9 g/dL (ref 6.0–8.3)

## 2020-04-12 LAB — POCT GLYCOSYLATED HEMOGLOBIN (HGB A1C): Hemoglobin A1C: 6.3 % — AB (ref 4.0–5.6)

## 2020-04-12 LAB — CBC
HCT: 40.2 % (ref 36.0–46.0)
Hemoglobin: 13.5 g/dL (ref 12.0–15.0)
MCHC: 33.6 g/dL (ref 30.0–36.0)
MCV: 91.2 fl (ref 78.0–100.0)
Platelets: 173 10*3/uL (ref 150.0–400.0)
RBC: 4.4 Mil/uL (ref 3.87–5.11)
RDW: 14.4 % (ref 11.5–15.5)
WBC: 5.6 10*3/uL (ref 4.0–10.5)

## 2020-04-12 LAB — URIC ACID: Uric Acid, Serum: 8.5 mg/dL — ABNORMAL HIGH (ref 2.4–7.0)

## 2020-04-12 MED ORDER — LOSARTAN POTASSIUM 100 MG PO TABS: 100.0000 mg | ORAL_TABLET | Freq: Every day | ORAL | 3 refills | Status: DC

## 2020-04-12 MED ORDER — ROSUVASTATIN CALCIUM 5 MG PO TABS: 5.0000 mg | ORAL_TABLET | Freq: Every day | ORAL | 3 refills | Status: DC

## 2020-04-12 NOTE — Assessment & Plan Note (Signed)
Denies recent gout flares.  Repeat uric acid level pending.

## 2020-04-12 NOTE — Progress Notes (Signed)
Subjective:    Patient ID: Olivia Moreno, female    DOB: 01/24/1966, 55 y.o.   MRN: [id number]  HPI  This visit occurred during the SARS-CoV-2 public health emergency.  Safety protocols were in place, including screening questions prior to the visit, additional usage of staff PPE, and extensive cleaning of exam room while observing appropriate contact time as indicated for disinfecting solutions.   Olivia Moreno is a 55 year old female with a history of hypertension, OSA, Type 2 Diabetes, hyperlipidemia, decreased renal function who presents today for complete physical.  Immunizations: -Tetanus: 2013 -Influenza: Completed this season  -Shingles: Never completed -Pneumonia: 2019 -Covid-19: Completed three doses  Diet: She endorses a fair diet. Exercise: No regular exercise, is planning on resuming her gym membership  Eye exam: UTD Dental exam: UTD  Pap Smear: Due Mammogram: 12/2019 Colonoscopy: Never completed, declines  Hep C Screen: Due  BP Readings from Last 3 Encounters:  04/12/20 128/82  12/30/19 120/82  07/02/19 128/84      Review of Systems  Constitutional: Negative for unexpected weight change.  HENT: Negative for rhinorrhea.   Eyes: Negative for visual disturbance.  Respiratory: Negative for cough and shortness of breath.   Cardiovascular: Negative for chest pain.  Gastrointestinal: Negative for constipation and diarrhea.  Genitourinary: Negative for difficulty urinating.  Musculoskeletal: Positive for arthralgias.  Skin: Negative for rash.  Allergic/Immunologic: Negative for environmental allergies.  Neurological: Negative for dizziness and headaches.  Hematological: Negative for adenopathy.  Psychiatric/Behavioral: The patient is not nervous/anxious.        Past Medical History:  Diagnosis Date  . Gout   . Hyperlipidemia   . Hypertension      Social History   Socioeconomic History  . Marital status: Married    Spouse name: Not on file  .  Number of children: 1  . Years of education: Not on file  . Highest education level: Not on file  Occupational History  . Occupation: unemployed    Associate Professor: UNEMPLOYED  Tobacco Use  . Smoking status: Never Smoker  . Smokeless tobacco: Never Used  Substance and Sexual Activity  . Alcohol use: No  . Drug use: No  . Sexual activity: Not on file  Other Topics Concern  . Not on file  Social History Narrative   Married.   1 child, 1 grandson.   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    Social Determinants of Health   Financial Resource Strain: Not on file  Food Insecurity: Not on file  Transportation Needs: Not on file  Physical Activity: Not on file  Stress: Not on file  Social Connections: Not on file  Intimate Partner Violence: Not on file    History reviewed. No pertinent surgical history.  Family History  Problem Relation Age of Onset  . Heart attack Mother   . Pulmonary embolism Father   . Pulmonary embolism Brother   . Breast cancer Neg Hx     Allergies  Allergen Reactions  . Penicillins Rash    Reaction as a child    Current Outpatient Medications on File Prior to Visit  Medication Sig Dispense Refill  . cetirizine (ZYRTEC) 10 MG tablet Take by mouth.    . chlorthalidone (HYGROTON) 25 MG tablet TAKE 1 TABLET BY MOUTH EVERY DAY FOR BLOOD PRESSURE 90 tablet 1  . Continuous Blood Gluc Receiver (FREESTYLE LIBRE 14 DAY READER) DEVI Use with sensor to check blood  sugar. 1 each 0  . Continuous Blood Gluc Sensor (FREESTYLE LIBRE 14 DAY SENSOR) MISC Use as directed to check blood sugars. 6 each 1  . glipiZIDE (GLUCOTROL) 5 MG tablet Take 1 tablet (5 mg total) by mouth 2 (two) times daily before a meal. For diabetes. 180 tablet 3  . glucose blood test strip Use three times daily as directed. 100 each 3  . losartan (COZAAR) 100 MG tablet Take 1 tablet (100 mg total) by mouth daily. Blood pressure. 90 tablet 3  . metFORMIN  (GLUCOPHAGE) 500 MG tablet TAKE 1 TABLET (500 MG TOTAL) BY MOUTH 2 (TWO) TIMES DAILY WITH A MEAL. FOR DIABETES. 180 tablet 1  . Naftifine HCl (NAFTIN) 2 % GEL Apply to affected area twice daily. 60 g 3  . rosuvastatin (CRESTOR) 5 MG tablet Take 1 tablet (5 mg total) by mouth daily. For cholesterol. 90 tablet 3   No current facility-administered medications on file prior to visit.    BP 128/82   Pulse 100   Temp (!) 97.1 F (36.2 C) (Temporal)   Ht 5\' 3"  (1.6 m)   Wt 246 lb (111.6 kg)   LMP 05/20/2010   SpO2 98%   BMI 43.58 kg/m    Objective:   Physical Exam Constitutional:      Appearance: She is well-nourished.  HENT:     Right Ear: Tympanic membrane and ear canal normal.     Left Ear: Tympanic membrane and ear canal normal.     Mouth/Throat:     Mouth: Oropharynx is clear and moist.  Eyes:     Extraocular Movements: EOM normal.     Pupils: Pupils are equal, round, and reactive to light.  Cardiovascular:     Rate and Rhythm: Normal rate and regular rhythm.  Pulmonary:     Effort: Pulmonary effort is normal.     Breath sounds: Normal breath sounds.  Abdominal:     General: Bowel sounds are normal.     Palpations: Abdomen is soft.     Tenderness: There is no abdominal tenderness.  Genitourinary:    Labia:        Right: No tenderness or lesion.        Left: No tenderness or lesion.      Cervix: Normal.     Uterus: Normal.      Adnexa: Right adnexa normal and left adnexa normal.  Musculoskeletal:        General: Normal range of motion.     Cervical back: Neck supple.  Skin:    General: Skin is warm and dry.  Neurological:     Mental Status: She is alert and oriented to person, place, and time.     Cranial Nerves: No cranial nerve deficit.     Deep Tendon Reflexes:     Reflex Scores:      Patellar reflexes are 2+ on the right side and 2+ on the left side. Psychiatric:        Mood and Affect: Mood and affect and mood normal.            Assessment & Plan:

## 2020-04-12 NOTE — Patient Instructions (Signed)
Stop by the lab prior to leaving today. I will notify you of your results once received.   Start exercising. You should be getting 150 minutes of moderate intensity exercise weekly.  It is important that you improve your diet. Please limit carbohydrates in the form of white bread, rice, pasta, sweets, fast food, fried food, sugary drinks, etc. Increase your consumption of fresh fruits and vegetables, whole grains, lean protein.  Ensure you are consuming 64 ounces of water daily.  Please schedule a follow up appointment in 6 months for diabetes follow up and shingles vaccine.  It was a pleasure to see you today!   Preventive Care 56-41 Years Old, Female Preventive care refers to lifestyle choices and visits with your health care provider that can promote health and wellness. This includes:  A yearly physical exam. This is also called an annual wellness visit.  Regular dental and eye exams.  Immunizations.  Screening for certain conditions.  Healthy lifestyle choices, such as: ? Eating a healthy diet. ? Getting regular exercise. ? Not using drugs or products that contain nicotine and tobacco. ? Limiting alcohol use. What can I expect for my preventive care visit? Physical exam Your health care provider will check your:  Height and weight. These may be used to calculate your BMI (body mass index). BMI is a measurement that tells if you are at a healthy weight.  Heart rate and blood pressure.  Body temperature.  Skin for abnormal spots. Counseling Your health care provider may ask you questions about your:  Past medical problems.  Family's medical history.  Alcohol, tobacco, and drug use.  Emotional well-being.  Home life and relationship well-being.  Sexual activity.  Diet, exercise, and sleep habits.  Work and work Statistician.  Access to firearms.  Method of birth control.  Menstrual cycle.  Pregnancy history. What immunizations do I need? Vaccines  are usually given at various ages, according to a schedule. Your health care provider will recommend vaccines for you based on your age, medical history, and lifestyle or other factors, such as travel or where you work.   What tests do I need? Blood tests  Lipid and cholesterol levels. These may be checked every 5 years, or more often if you are over 56 years old.  Hepatitis C test.  Hepatitis B test. Screening  Lung cancer screening. You may have this screening every year starting at age 8 if you have a 30-pack-year history of smoking and currently smoke or have quit within the past 15 years.  Colorectal cancer screening. ? All adults should have this screening starting at age 17 and continuing until age 24. ? Your health care provider may recommend screening at age 39 if you are at increased risk. ? You will have tests every 1-10 years, depending on your results and the type of screening test.  Diabetes screening. ? This is done by checking your blood sugar (glucose) after you have not eaten for a while (fasting). ? You may have this done every 1-3 years.  Mammogram. ? This may be done every 1-2 years. ? Talk with your health care provider about when you should start having regular mammograms. This may depend on whether you have a family history of breast cancer.  BRCA-related cancer screening. This may be done if you have a family history of breast, ovarian, tubal, or peritoneal cancers.  Pelvic exam and Pap test. ? This may be done every 3 years starting at age 71. ? Starting at  age 52, this may be done every 5 years if you have a Pap test in combination with an HPV test. Other tests  STD (sexually transmitted disease) testing, if you are at risk.  Bone density scan. This is done to screen for osteoporosis. You may have this scan if you are at high risk for osteoporosis. Talk with your health care provider about your test results, treatment options, and if necessary, the need  for more tests. Follow these instructions at home: Eating and drinking  Eat a diet that includes fresh fruits and vegetables, whole grains, lean protein, and low-fat dairy products.  Take vitamin and mineral supplements as recommended by your health care provider.  Do not drink alcohol if: ? Your health care provider tells you not to drink. ? You are pregnant, may be pregnant, or are planning to become pregnant.  If you drink alcohol: ? Limit how much you have to 0-1 drink a day. ? Be aware of how much alcohol is in your drink. In the [place] one drink equals one 12 oz bottle of beer (355 mL), one 5 oz glass of wine (148 mL), or one 1 oz glass of hard liquor (44 mL).   Lifestyle  Take daily care of your teeth and gums. Brush your teeth every morning and night with fluoride toothpaste. Floss one time each day.  Stay active. Exercise for at least 30 minutes 5 or more days each week.  Do not use any products that contain nicotine or tobacco, such as cigarettes, e-cigarettes, and chewing tobacco. If you need help quitting, ask your health care provider.  Do not use drugs.  If you are sexually active, practice safe sex. Use a condom or other form of protection to prevent STIs (sexually transmitted infections).  If you do not wish to become pregnant, use a form of birth control. If you plan to become pregnant, see your health care provider for a prepregnancy visit.  If told by your health care provider, take low-dose aspirin daily starting at age 30.  Find healthy ways to cope with stress, such as: ? Meditation, yoga, or listening to music. ? Journaling. ? Talking to a trusted person. ? Spending time with friends and family. Safety  Always wear your seat belt while driving or riding in a vehicle.  Do not drive: ? If you have been drinking alcohol. Do not ride with someone who has been drinking. ? When you are tired or distracted. ? While texting.  Wear a helmet and other  protective equipment during sports activities.  If you have firearms in your house, make sure you follow all gun safety procedures. What's next?  Visit your health care provider once a year for an annual wellness visit.  Ask your health care provider how often you should have your eyes and teeth checked.  Stay up to date on all vaccines. This information is not intended to replace advice given to you by your health care provider. Make sure you discuss any questions you have with your health care provider. Document Revised: 11/09/2019 Document Reviewed: 10/16/2017 Elsevier Patient Education  2021 [employer].

## 2020-04-12 NOTE — Assessment & Plan Note (Signed)
Chronic, continued, no change. Continue to monitor.

## 2020-04-12 NOTE — Assessment & Plan Note (Signed)
Due for Shingrix, first vaccine provided today. Other vaccines UTD. Declines colonoscopy screening despite recommendations, she is overdue. Mammogram UTD. Pap smear due, completed today.  Discussed the importance of a healthy diet and regular exercise in order for weight loss, and to reduce the risk of any potential medical problems.  Exam today unremarkable. Labs pending.

## 2020-04-12 NOTE — Assessment & Plan Note (Signed)
Compliant to Crestor 5 mg, refills provided. Repeat lipid panel pending.

## 2020-04-12 NOTE — Assessment & Plan Note (Signed)
A1c of 6.3 today, commended her on this! Continue Metformin 500 mg BID and Glipizide 5 mg BID.  Encouraged weight loss through diet and exercise.  Eye and foot exams UTD. Managed on ARB and statin.  Follow up in 6 months.

## 2020-04-12 NOTE — Assessment & Plan Note (Signed)
Compliant to CPAP nightly, continue same.  

## 2020-04-12 NOTE — Assessment & Plan Note (Signed)
Well controlled on losartan 100 mg. Losartan has been on national backorder so we discussed for her to notify if her pharmacy is unable to fill. In this event we will switch to olmesartan.  CMP pending.

## 2020-04-13 LAB — CYTOLOGY - PAP
Comment: NEGATIVE
Diagnosis: NEGATIVE
High risk HPV: NEGATIVE

## 2020-04-13 LAB — HIV ANTIBODY (ROUTINE TESTING W REFLEX): HIV 1&2 Ab, 4th Generation: NONREACTIVE

## 2020-04-13 LAB — HEPATITIS C ANTIBODY
Hepatitis C Ab: NONREACTIVE
SIGNAL TO CUT-OFF: 0.01 (ref <1.00)

## 2020-04-18 NOTE — Addendum Note (Signed)
Addended by: Donnamarie Poag on: 04/18/2020 09:48 AM   Modules accepted: Orders

## 2020-07-12 ENCOUNTER — Other Ambulatory Visit: Payer: Self-pay | Admitting: Primary Care

## 2020-07-12 DIAGNOSIS — E119 Type 2 diabetes mellitus without complications: Secondary | ICD-10-CM

## 2020-07-12 DIAGNOSIS — I10 Essential (primary) hypertension: Secondary | ICD-10-CM

## 2020-08-01 LAB — HM DIABETES EYE EXAM

## 2020-08-07 ENCOUNTER — Encounter: Payer: Self-pay | Admitting: Primary Care

## 2020-09-17 ENCOUNTER — Other Ambulatory Visit: Payer: Self-pay | Admitting: Primary Care

## 2020-09-17 DIAGNOSIS — E119 Type 2 diabetes mellitus without complications: Secondary | ICD-10-CM

## 2020-10-10 ENCOUNTER — Ambulatory Visit (INDEPENDENT_AMBULATORY_CARE_PROVIDER_SITE_OTHER): Payer: BC Managed Care – PPO | Admitting: Primary Care

## 2020-10-10 ENCOUNTER — Other Ambulatory Visit: Payer: Self-pay

## 2020-10-10 ENCOUNTER — Encounter: Payer: Self-pay | Admitting: Primary Care

## 2020-10-10 VITALS — BP 136/78 | HR 82 | Temp 98.0°F | Ht 63.0 in | Wt 248.0 lb

## 2020-10-10 DIAGNOSIS — N289 Disorder of kidney and ureter, unspecified: Secondary | ICD-10-CM | POA: Diagnosis not present

## 2020-10-10 DIAGNOSIS — Z23 Encounter for immunization: Secondary | ICD-10-CM | POA: Diagnosis not present

## 2020-10-10 DIAGNOSIS — E119 Type 2 diabetes mellitus without complications: Secondary | ICD-10-CM

## 2020-10-10 DIAGNOSIS — M1A9XX0 Chronic gout, unspecified, without tophus (tophi): Secondary | ICD-10-CM | POA: Diagnosis not present

## 2020-10-10 LAB — BASIC METABOLIC PANEL
BUN: 21 mg/dL (ref 6–23)
CO2: 25 mEq/L (ref 19–32)
Calcium: 9.8 mg/dL (ref 8.4–10.5)
Chloride: 102 mEq/L (ref 96–112)
Creatinine, Ser: 1.17 mg/dL (ref 0.40–1.20)
GFR: 52.36 mL/min — ABNORMAL LOW (ref >60.00)
Glucose, Bld: 172 mg/dL — ABNORMAL HIGH (ref 70–99)
Potassium: 4.1 mEq/L (ref 3.5–5.1)
Sodium: 138 mEq/L (ref 135–145)

## 2020-10-10 LAB — HEMOGLOBIN A1C: Hgb A1c MFr Bld: 6.6 % — ABNORMAL HIGH (ref 4.6–6.5)

## 2020-10-10 LAB — URIC ACID: Uric Acid, Serum: 7.4 mg/dL — ABNORMAL HIGH (ref 2.4–7.0)

## 2020-10-10 NOTE — Assessment & Plan Note (Addendum)
Repeat renal function pending.  Not taking NSAIDs, decent water intake. Evaluated by nephrology who did not recommend renal ultrasound. Will consider if there is a drop in renal function.

## 2020-10-10 NOTE — Progress Notes (Signed)
Subjective:    Patient ID: Olivia Moreno, female    DOB: 12/13/1965, 55 y.o.   MRN: [id number]  HPI  Olivia Moreno is a very pleasant 55 y.o. female with a history of hypertension, type 2 diabetes, morbid obesity, decreased renal function, sleep apnea who presents today for follow up of diabetes, gout, and repeat renal testing. She is also due for a Shingles vaccine.   1) Type 2 Diabetes:  Current medications include: Glipizide 5 mg BID, Metformin 500 mg BID  She is checking her blood glucose 0 times daily.   Last A1C: 6.3 in February 2022 Last Eye Exam: UTD Last Foot Exam: Due Pneumonia Vaccination: 2019 Urine Microalbumin: None. Managed on ARB. Statin: Crestor  Dietary changes since last visit: Cutting back on portion sizes, eating vegetables, lean protein.    Exercise: No regular exercise.   2) Chronic Gout: No recent gout flares, last flare was in February 2022, uric acid level of 8.5 during that time. She is not on preventative treatment.   BP Readings from Last 3 Encounters:  10/10/20 136/78  04/12/20 128/82  12/30/19 120/82       Review of Systems  Eyes:  Negative for visual disturbance.  Musculoskeletal:  Negative for arthralgias and joint swelling.  Neurological:  Negative for dizziness, numbness and headaches.        Past Medical History:  Diagnosis Date   Gout    Hyperlipidemia    Hypertension     Social History   Socioeconomic History   Marital status: Married    Spouse name: Not on file   Number of children: 1   Years of education: Not on file   Highest education level: Not on file  Occupational History   Occupation: unemployed    Employer: UNEMPLOYED  Tobacco Use   Smoking status: Never   Smokeless tobacco: Never  Substance and Sexual Activity   Alcohol use: No   Drug use: No   Sexual activity: Not on file  Other Topics Concern   Not on file  Social History Narrative   Married.   1 child, 1 grandson.   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    Enjoys going to the beach.    Social Determinants of Health   Financial Resource Strain: Not on file  Food Insecurity: Not on file  Transportation Needs: Not on file  Physical Activity: Not on file  Stress: Not on file  Social Connections: Not on file  Intimate Partner Violence: Not on file    History reviewed. No pertinent surgical history.  Family History  Problem Relation Age of Onset   Heart attack Mother    Pulmonary embolism Father    Pulmonary embolism Brother    Breast cancer Neg Hx     Allergies  Allergen Reactions   Penicillins Rash    Reaction as a child    Current Outpatient Medications on File Prior to Visit  Medication Sig Dispense Refill   cetirizine (ZYRTEC) 10 MG tablet Take by mouth.     chlorthalidone (HYGROTON) 25 MG tablet TAKE 1 TABLET BY MOUTH EVERY DAY FOR BLOOD PRESSURE 90 tablet 2   glipiZIDE (GLUCOTROL) 5 MG tablet TAKE 1 TABLET (5 MG TOTAL) BY MOUTH 2 (TWO) TIMES DAILY BEFORE A MEAL. FOR DIABETES. 180 tablet 1   glucose blood test strip Use three times daily as directed. 100 each 3   losartan (COZAAR) 100 MG tablet Take 1 tablet (100 mg total)  by mouth daily. Blood pressure. 90 tablet 3   metFORMIN (GLUCOPHAGE) 500 MG tablet TAKE 1 TABLET (500 MG TOTAL) BY MOUTH 2 (TWO) TIMES DAILY WITH A MEAL. FOR DIABETES. 180 tablet 2   rosuvastatin (CRESTOR) 5 MG tablet Take 1 tablet (5 mg total) by mouth daily. For cholesterol. 90 tablet 3   No current facility-administered medications on file prior to visit.    BP 136/78   Pulse 82   Temp 98 F (36.7 C) (Temporal)   Ht 5\' 3"  (1.6 m)   Wt 248 lb (112.5 kg)   LMP 05/20/2010   SpO2 97%   BMI 43.93 kg/m  Objective:   Physical Exam Cardiovascular:     Rate and Rhythm: Normal rate and regular rhythm.  Pulmonary:     Effort: Pulmonary effort is normal.     Breath sounds: Normal breath sounds.  Musculoskeletal:     Cervical back: Neck supple.  Skin:    General:  Skin is warm and dry.          Assessment & Plan:      This visit occurred during the SARS-CoV-2 public health emergency.  Safety protocols were in place, including screening questions prior to the visit, additional usage of staff PPE, and extensive cleaning of exam room while observing appropriate contact time as indicated for disinfecting solutions.

## 2020-10-10 NOTE — Assessment & Plan Note (Signed)
Compliant to prescribed regimen of Glipizide 5 mg BID and metformin 500 mg BID. Continue same.  Repeat A1C pending. Foot exam today. Managed on statin and ARB. Eye exam UTD.  Follow up in 3-6 months depending on A1C result.

## 2020-10-10 NOTE — Assessment & Plan Note (Signed)
No recent gout flares. Repeat Uric acid level pending. Not on preventative gout treatment.

## 2020-10-10 NOTE — Addendum Note (Signed)
Addended by: Donnamarie Poag on: 10/10/2020 10:03 AM   Modules accepted: Orders

## 2020-10-10 NOTE — Patient Instructions (Addendum)
Stop by the lab prior to leaving today. I will notify you of your results once received.   Please schedule a physical to meet with me in February 2023.   It was a pleasure to see you today!        Recombinant Zoster (Shingles) Vaccine: What You Need to Know 1. Why get vaccinated? Recombinant zoster (shingles) vaccine can prevent shingles. Shingles (also called herpes zoster, or just zoster) is a painful skin rash, usually with blisters. In addition to the rash, shingles can cause fever, headache, chills, or upset stomach. More rarely, shingles can lead to pneumonia, hearingproblems, blindness, brain inflammation (encephalitis), or death. The most common complication of shingles is long-term nerve pain called postherpetic neuralgia (PHN). PHN occurs in the areas where the shingles rash was, even after the rash clears up. It can last for months or years after therash goes away. The pain from PHN can be severe and debilitating. About 10 to 18% of people who get shingles will experience PHN. The risk of PHN increases with age. An older adult with shingles is more likely to develop PHN and have longer lasting and more severe pain than a younger person withshingles. Shingles is caused by the varicella zoster virus, the same virus that causes chickenpox. After you have chickenpox, the virus stays in your body and can cause shingles later in life. Shingles cannot be passed from one person to another, but the virus that causes shingles can spread and cause chickenpox insomeone who had never had chickenpox or received chickenpox vaccine. 2. Recombinant shingles vaccine Recombinant shingles vaccine provides strong protection against shingles. Bypreventing shingles, recombinant shingles vaccine also protects against PHN. Recombinant shingles vaccine is the preferred vaccine for the prevention of shingles. However, a different vaccine, live shingles vaccine, may be used in somecircumstances. The recombinant  shingles vaccine is recommended for adults 50 years and older without serious immune problems. It is given as a two-dose series. This vaccine is also recommended for people who have already gotten another type of shingles vaccine, the live shingles vaccine. There is no live virus inthis vaccine. Shingles vaccine may be given at the same time as other vaccines. 3. Talk with your health care provider Tell your vaccine provider if the person getting the vaccine: Has had an allergic reaction after a previous dose of recombinant shingles vaccine, or has any severe, life-threatening allergies. Is pregnant or breastfeeding. Is currently experiencing an episode of shingles. In some cases, your health care provider may decide to postpone shinglesvaccination to a future visit. People with minor illnesses, such as a cold, may be vaccinated. People who are moderately or severely ill should usually wait until they recover beforegetting recombinant shingles vaccine. Your health care provider can give you more information. 4. Risks of a vaccine reaction A sore arm with mild or moderate pain is very common after recombinant shingles vaccine, affecting about 80% of vaccinated people. Redness and swelling can also happen at the site of the injection. Tiredness, muscle pain, headache, shivering, fever, stomach pain, and nausea happen after vaccination in more than half of people who receive recombinant shingles vaccine. In clinical trials, about 1 out of 6 people who got recombinant zoster vaccine experienced side effects that prevented them from doing regular activities.Symptoms usually went away on their own in 2 to 3 days. You should still get the second dose of recombinant zoster vaccine even if youhad one of these reactions after the first dose. People sometimes faint after medical procedures, including  vaccination. Tellyour provider if you feel dizzy or have vision changes or ringing in the ears. As with any  medicine, there is a very remote chance of a vaccine causing asevere allergic reaction, other serious injury, or death. 5. What if there is a serious problem? An allergic reaction could occur after the vaccinated person leaves the clinic. If you see signs of a severe allergic reaction (hives, swelling of the face and throat, difficulty breathing, a fast heartbeat, dizziness, or weakness), call 9-1-1 and get the person to the nearest hospital. For other signs that concern you, call your health care provider. Adverse reactions should be reported to the Vaccine Adverse Event Reporting System (VAERS). Your health care provider will usually file this report, or you can do it yourself. Visit the VAERS website at www.vaers.LAgents.no or call 706-067-0463. VAERS is only for reporting reactions, and VAERS staff do not give medical advice. 6. How can I learn more? Ask your health care provider. Call your local or state health department. Contact the Centers for Disease Control and Prevention (CDC): Call 825 248 3657 (1-800-CDC-INFO) or Visit CDC's website at PicCapture.uy Vaccine Information Statement Recombinant Zoster Vaccine (12/17/2017) This information is not intended to replace advice given to you by your health care provider. Make sure you discuss any questions you have with your healthcare provider. Document Revised: 10/08/2019 Document Reviewed: 10/08/2019 Elsevier Patient Education  2022 [employer].

## 2020-12-26 ENCOUNTER — Other Ambulatory Visit: Payer: Self-pay | Admitting: Primary Care

## 2020-12-26 DIAGNOSIS — Z1231 Encounter for screening mammogram for malignant neoplasm of breast: Secondary | ICD-10-CM

## 2021-01-10 ENCOUNTER — Ambulatory Visit
Admission: RE | Admit: 2021-01-10 | Discharge: 2021-01-10 | Disposition: A | Discharge status: Home / Self Care | Payer: BC Managed Care – PPO | Source: Ambulatory Visit | Attending: Primary Care | Admitting: Primary Care

## 2021-01-10 ENCOUNTER — Other Ambulatory Visit: Payer: Self-pay

## 2021-01-10 DIAGNOSIS — Z1231 Encounter for screening mammogram for malignant neoplasm of breast: Secondary | ICD-10-CM | POA: Insufficient documentation

## 2021-01-16 NOTE — Telephone Encounter (Signed)
I spoke with pt; pt tested positive for covid with PCR test at [facility] on 01/16/21. On 01/14/21 pt started with deep prod cough with yellow phlegm, head congestion, body aches., now pt continues with prod cough with yellow phlegm, head congestion and body aches. No fever but pt taking tylenol extra strength 500 mg taking two tabs q 6 h. Pt is also having diarrhea that has some form and watery also which started on 01/16/21. Pt is drinking water to stay dehydrated. No dry mouth, abd pain or dizziness. Had S/T on 01/14/21 and 01/15/21 but no S/T today. Pt does not have any wheezing, SOB,H/A, vomiting,runny nose and no loss of taste or smell.Self quarantine, drink plenty of fluids, rest, and take Tylenol for fever. UC & ED precautions given and pt voiced understanding. Pt did not want to schedule video visit but wanted note sent to Mayra Reel NP to see how she wants pt to proceed and since pt is diabetic and has hypertension if pt needs oral covid med.sending note to Allayne Gitelman NP and [facility] CMA; will also teams Joellen.

## 2021-01-16 NOTE — Telephone Encounter (Signed)
Called left message to call office so that we can get on to be seen virtual tomorrow.

## 2021-01-16 NOTE — Telephone Encounter (Signed)
Patient needs video visit for treatment. Please schedule.

## 2021-01-23 ENCOUNTER — Other Ambulatory Visit: Payer: Self-pay

## 2021-01-23 ENCOUNTER — Telehealth (INDEPENDENT_AMBULATORY_CARE_PROVIDER_SITE_OTHER): Payer: BC Managed Care – PPO | Admitting: Family Medicine

## 2021-01-23 ENCOUNTER — Encounter: Payer: Self-pay | Admitting: Family Medicine

## 2021-01-23 VITALS — Ht 63.0 in

## 2021-01-23 DIAGNOSIS — R0609 Other forms of dyspnea: Secondary | ICD-10-CM

## 2021-01-23 DIAGNOSIS — U071 COVID-19: Secondary | ICD-10-CM | POA: Diagnosis not present

## 2021-01-23 HISTORY — DX: COVID-19: U07.1

## 2021-01-23 MED ORDER — AZITHROMYCIN 250 MG PO TABS: ORAL_TABLET | ORAL | 0 refills | Status: DC

## 2021-01-23 NOTE — Assessment & Plan Note (Signed)
Rest, push fluids. Out of range for use of antivirals. Mucinex DM.  Avoid decongestants given you high blood pressure. Can continue flonase, but consider adding nasla saline irrigation or spray.  Start antibiotics to cover for possible bacterial super-infeciton, given you were better and now have worsened with change in color of mucus.  If not improving, come in for in person exam of lungs.

## 2021-01-23 NOTE — Progress Notes (Signed)
VIRTUAL VISIT Due to national recommendations of social distancing due to COVID 19, a virtual visit is felt to be most appropriate for this patient at this time.   I connected with the patient on 01/23/21 at  8:20 AM EST by virtual telehealth platform and verified that I am speaking with the correct person using two identifiers.   I discussed the limitations, risks, security and privacy concerns of performing an evaluation and management service by  virtual telehealth platform and the availability of in person appointments. I also discussed with the patient that there may be a patient responsible charge related to this service. The patient expressed understanding and agreed to proceed.  Patient location: Home Provider Location: [place] Participants: Kerby Nora and [name]   Chief Complaint  Patient presents with   Covid Positive    Tested Positive 01/14/21 at [facility]   Nasal Congestion   Cough   Facial Pain    Pain Behind Eyes   Shortness of Breath    On exertion    History of Present Illness:  55 year old female patient of Mayra Reel, NP with history of DM, HTN, morbid obesity present with new COVID infection. Date of Onset: 01/13/21  She reports she started with deep cough, body ache and congestion.  She was doing well unitl when went back to work yesterday.   Winded with walking, ( no SOB at rest)  mucus had become clear but now yellow again (  in 24 hour)   No current ear pain or  she does have left sided facial pain. She is fatigued.  No clear wheeze.  She is able to rest at night using Nyquil.  She was using Dayquil, using flonase 2 sprays per nostril.  Does no check blood sugars.    No fever.  COVID 19 screen COVID testing: 01/14/2021 COVID vaccine: 12/17/2019 , 05/08/2019 , 04/17/2019 COVID exposure: No recent travel or known exposure to COVID19  The importance of social distancing was discussed today.     Review of Systems   Constitutional:  Negative for chills and fever.  HENT:  Positive for congestion. Negative for ear pain.   Eyes:  Negative for pain and redness.  Respiratory:  Positive for shortness of breath. Negative for cough and wheezing.   Cardiovascular:  Negative for chest pain, palpitations and leg swelling.  Gastrointestinal:  Negative for abdominal pain, blood in stool, constipation, diarrhea, nausea and vomiting.  Genitourinary:  Negative for dysuria.  Musculoskeletal:  Negative for falls and myalgias.  Skin:  Negative for rash.  Neurological:  Negative for dizziness.  Psychiatric/Behavioral:  Negative for depression. The patient is not nervous/anxious.      Past Medical History:  Diagnosis Date   Gout    Hyperlipidemia    Hypertension     reports that she has never smoked. She has never used smokeless tobacco. She reports that she does not drink alcohol and does not use drugs.   Current Outpatient Medications:    cetirizine (ZYRTEC) 10 MG tablet, Take by mouth., Disp: , Rfl:    chlorthalidone (HYGROTON) 25 MG tablet, TAKE 1 TABLET BY MOUTH EVERY DAY FOR BLOOD PRESSURE, Disp: 90 tablet, Rfl: 2   glipiZIDE (GLUCOTROL) 5 MG tablet, TAKE 1 TABLET (5 MG TOTAL) BY MOUTH 2 (TWO) TIMES DAILY BEFORE A MEAL. FOR DIABETES., Disp: 180 tablet, Rfl: 1   glucose blood test strip, Use three times daily as directed., Disp: 100 each, Rfl: 3  losartan (COZAAR) 100 MG tablet, Take 1 tablet (100 mg total) by mouth daily. Blood pressure., Disp: 90 tablet, Rfl: 3   metFORMIN (GLUCOPHAGE) 500 MG tablet, TAKE 1 TABLET (500 MG TOTAL) BY MOUTH 2 (TWO) TIMES DAILY WITH A MEAL. FOR DIABETES., Disp: 180 tablet, Rfl: 2   rosuvastatin (CRESTOR) 5 MG tablet, Take 1 tablet (5 mg total) by mouth daily. For cholesterol., Disp: 90 tablet, Rfl: 3   Observations/Objective: Height 5\' 3"  (1.6 m), last menstrual period 05/20/2010.  BP 130/80s  Physical Exam  Physical Exam Constitutional:      General: The patient is not in  acute distress. Pulmonary:     Effort: Pulmonary effort is normal. No respiratory distress.  Neurological:     Mental Status: The patient is alert and oriented to person, place, and time.  Psychiatric:        Mood and Affect: Mood normal.        Behavior: Behavior normal.   Assessment and Plan Problem List Items Addressed This Visit     COVID-19 virus infection - Primary     Rest, push fluids. Out of range for use of antivirals. Mucinex DM.  Avoid decongestants given you high blood pressure. Can continue flonase, but consider adding nasla saline irrigation or spray.  Start antibiotics to cover for possible bacterial super-infeciton, given you were better and now have worsened with change in color of mucus.  If not improving, come in for in person exam of lungs.      Relevant Medications   azithromycin (ZITHROMAX) 250 MG tablet   DOE (dyspnea on exertion)      Meds ordered this encounter  Medications   azithromycin (ZITHROMAX) 250 MG tablet    Sig: 2 tab po x 1 day then 1 tab po daily    Dispense:  6 tablet    Refill:  0    I discussed the assessment and treatment plan with the patient. The patient was provided an opportunity to ask questions and all were answered. The patient agreed with the plan and demonstrated an understanding of the instructions.   The patient was advised to call back or seek an in-person evaluation if the symptoms worsen or if the condition fails to improve as anticipated.     07/20/2010, MD

## 2021-01-23 NOTE — Patient Instructions (Addendum)
Rest, push fluids. Mucinex DM.  Avoid decongestants given you high blood pressure. C an continue flonase, but consider adding nasla saline irrigation or spray.  Start antibiotics to cover for possible bacterial super-infeciton, given you were better and now have worsened with change in color of mucus.  If not improving, come in for in person exam of lungs.

## 2021-01-24 ENCOUNTER — Encounter: Payer: Self-pay | Admitting: Family Medicine

## 2021-04-12 ENCOUNTER — Encounter: Payer: BC Managed Care – PPO | Admitting: Primary Care

## 2021-04-13 ENCOUNTER — Other Ambulatory Visit: Payer: Self-pay | Admitting: Primary Care

## 2021-04-13 DIAGNOSIS — E782 Mixed hyperlipidemia: Secondary | ICD-10-CM

## 2021-04-13 DIAGNOSIS — E119 Type 2 diabetes mellitus without complications: Secondary | ICD-10-CM

## 2021-04-13 DIAGNOSIS — I10 Essential (primary) hypertension: Secondary | ICD-10-CM

## 2021-05-24 ENCOUNTER — Ambulatory Visit (INDEPENDENT_AMBULATORY_CARE_PROVIDER_SITE_OTHER): Payer: BC Managed Care – PPO | Admitting: Primary Care

## 2021-05-24 ENCOUNTER — Encounter: Payer: Self-pay | Admitting: Primary Care

## 2021-05-24 VITALS — BP 124/88 | HR 89 | Ht 63.0 in | Wt 255.8 lb

## 2021-05-24 DIAGNOSIS — E1165 Type 2 diabetes mellitus with hyperglycemia: Secondary | ICD-10-CM

## 2021-05-24 DIAGNOSIS — M1A4710 Other secondary chronic gout, right ankle and foot, without tophus (tophi): Secondary | ICD-10-CM

## 2021-05-24 DIAGNOSIS — I10 Essential (primary) hypertension: Secondary | ICD-10-CM

## 2021-05-24 DIAGNOSIS — Z0000 Encounter for general adult medical examination without abnormal findings: Secondary | ICD-10-CM | POA: Diagnosis not present

## 2021-05-24 DIAGNOSIS — N289 Disorder of kidney and ureter, unspecified: Secondary | ICD-10-CM

## 2021-05-24 DIAGNOSIS — E119 Type 2 diabetes mellitus without complications: Secondary | ICD-10-CM | POA: Diagnosis not present

## 2021-05-24 DIAGNOSIS — E559 Vitamin D deficiency, unspecified: Secondary | ICD-10-CM

## 2021-05-24 DIAGNOSIS — G4733 Obstructive sleep apnea (adult) (pediatric): Secondary | ICD-10-CM | POA: Diagnosis not present

## 2021-05-24 DIAGNOSIS — E7800 Pure hypercholesterolemia, unspecified: Secondary | ICD-10-CM | POA: Diagnosis not present

## 2021-05-24 DIAGNOSIS — Z23 Encounter for immunization: Secondary | ICD-10-CM

## 2021-05-24 DIAGNOSIS — M2540 Effusion, unspecified joint: Secondary | ICD-10-CM

## 2021-05-24 LAB — CBC
HCT: 39.1 % (ref 36.0–46.0)
Hemoglobin: 13 g/dL (ref 12.0–15.0)
MCHC: 33.2 g/dL (ref 30.0–36.0)
MCV: 91.4 fl (ref 78.0–100.0)
Platelets: 147 10*3/uL — ABNORMAL LOW (ref 150.0–400.0)
RBC: 4.28 Mil/uL (ref 3.87–5.11)
RDW: 15.3 % (ref 11.5–15.5)
WBC: 4.9 10*3/uL (ref 4.0–10.5)

## 2021-05-24 LAB — COMPREHENSIVE METABOLIC PANEL
ALT: 24 U/L (ref 0–35)
AST: 24 U/L (ref 0–37)
Albumin: 4.4 g/dL (ref 3.5–5.2)
Alkaline Phosphatase: 80 U/L (ref 39–117)
BUN: 24 mg/dL — ABNORMAL HIGH (ref 6–23)
CO2: 27 mEq/L (ref 19–32)
Calcium: 9.5 mg/dL (ref 8.4–10.5)
Chloride: 104 mEq/L (ref 96–112)
Creatinine, Ser: 1.08 mg/dL (ref 0.40–1.20)
GFR: 57.39 mL/min — ABNORMAL LOW (ref >60.00)
Glucose, Bld: 197 mg/dL — ABNORMAL HIGH (ref 70–99)
Potassium: 4.4 mEq/L (ref 3.5–5.1)
Sodium: 140 mEq/L (ref 135–145)
Total Bilirubin: 0.6 mg/dL (ref 0.2–1.2)
Total Protein: 7 g/dL (ref 6.0–8.3)

## 2021-05-24 LAB — POCT GLYCOSYLATED HEMOGLOBIN (HGB A1C): Hemoglobin A1C: 6.6 % — AB (ref 4.0–5.6)

## 2021-05-24 LAB — VITAMIN D 25 HYDROXY (VIT D DEFICIENCY, FRACTURES): VITD: 13.16 ng/mL — ABNORMAL LOW (ref 30.00–100.00)

## 2021-05-24 LAB — LIPID PANEL
Cholesterol: 127 mg/dL (ref 0–200)
HDL: 45.5 mg/dL (ref >39.00)
LDL Cholesterol: 61 mg/dL (ref 0–99)
NonHDL: 81.64
Total CHOL/HDL Ratio: 3
Triglycerides: 102 mg/dL (ref 0.0–149.0)
VLDL: 20.4 mg/dL (ref 0.0–40.0)

## 2021-05-24 LAB — VITAMIN B12: Vitamin B-12: 268 pg/mL (ref 211–911)

## 2021-05-24 LAB — URIC ACID: Uric Acid, Serum: 7.8 mg/dL — ABNORMAL HIGH (ref 2.4–7.0)

## 2021-05-24 MED ORDER — METFORMIN HCL ER 500 MG PO TB24: 500.0000 mg | ORAL_TABLET | Freq: Every day | ORAL | 1 refills | Status: DC

## 2021-05-24 MED ORDER — GLIPIZIDE ER 5 MG PO TB24: 5.0000 mg | ORAL_TABLET | Freq: Every day | ORAL | 1 refills | Status: DC

## 2021-05-24 NOTE — Assessment & Plan Note (Signed)
Tetanus due, provided today.  Other vaccines up-to-date. ? ?Pap smear up-to-date. ?Mammogram up-to-date. ?Declines colonoscopy/colon cancer screening despite recommendations. ? ?Discussed the importance of a healthy diet and regular exercise in order for weight loss, and to reduce the risk of further co-morbidity. ? ?Exam today stable ?Labs pending. ?

## 2021-05-24 NOTE — Assessment & Plan Note (Addendum)
Controlled with A1C of 6.6 today. ? ?She would like to reduce her pill count so we will change the metformin to XR 500 mg once daily, and glipizide to XL 5 mg daily. ? ?Discussed to work on healthy diet regular exercise. ?Managed on statin and ARB. ? ?Follow-up in 6 months. ?

## 2021-05-24 NOTE — Assessment & Plan Note (Signed)
Controlled. ? ?Continue losartan 100 mg daily, chlorthalidone 25 mg daily. ? ?CMP pending. ?

## 2021-05-24 NOTE — Progress Notes (Signed)
? ?Subjective:  ? ? Patient ID: Olivia Moreno, female    DOB: 1965/12/29, 56 y.o.   MRN: [id number] ? ?HPI ? ?[name] is a very pleasant 56 y.o. female who presents today for complete physical and follow up of chronic conditions. ? ?Immunizations: ?-Tetanus: 2013 ?-Influenza: Completed this season  ?-Covid-19: 3 vaccines ?-Shingles: Completed Shingrix ?-Pneumonia: 2019 ? ?Diet: Fair diet.  ?Exercise: No regular exercise. ? ?Eye exam: Completes annually  ?Dental exam: Completes semi-annually  ? ?Pap Smear: Completed in 2022 ?Mammogram: Completed in November 2022 ?Colonoscopy: Never completed, declines  ? ?BP Readings from Last 3 Encounters:  ?05/24/21 124/88  ?10/10/20 136/78  ?04/12/20 128/82  ? ? ? ? ? ? ? ?Review of Systems  ?Constitutional:  Positive for fatigue. Negative for unexpected weight change.  ?HENT:  Negative for rhinorrhea.   ?Respiratory:  Negative for cough and shortness of breath.   ?Cardiovascular:  Negative for chest pain.  ?Gastrointestinal:  Negative for constipation and diarrhea.  ?Genitourinary:  Negative for difficulty urinating.  ?Musculoskeletal:  Negative for arthralgias and myalgias.  ?Skin:  Negative for rash.  ?Allergic/Immunologic: Negative for environmental allergies.  ?Neurological:  Negative for dizziness and headaches.  ?Psychiatric/Behavioral:  The patient is not nervous/anxious.   ? ?   ? ? ?Past Medical History:  ?Diagnosis Date  ? Gout   ? Hyperlipidemia   ? Hypertension   ? ? ?Social History  ? ?Socioeconomic History  ? Marital status: Married  ?  Spouse name: Not on file  ? Number of children: 1  ? Years of education: Not on file  ? Highest education level: Not on file  ?Occupational History  ? Occupation: unemployed  ?  Employer: UNEMPLOYED  ?Tobacco Use  ? Smoking status: Never  ? Smokeless tobacco: Never  ?Substance and Sexual Activity  ? Alcohol use: No  ? Drug use: No  ? Sexual activity: Not on file  ?Other Topics Concern  ? Not on file  ?Social History  Narrative  ? Married.  ? 1 child, 1 grandson.  ? 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   ? Enjoys going to the beach.   ? ?Social Determinants of Health  ? ?Financial Resource Strain: Not on file  ?Food Insecurity: Not on file  ?Transportation Needs: Not on file  ?Physical Activity: Not on file  ?Stress: Not on file  ?Social Connections: Not on file  ?Intimate Partner Violence: Not on file  ? ? ?History reviewed. No pertinent surgical history. ? ?Family History  ?Problem Relation Age of Onset  ? Heart attack Mother   ? Pulmonary embolism Father   ? Pulmonary embolism Brother   ? Breast cancer Neg Hx   ? ? ?Allergies  ?Allergen Reactions  ? Penicillins Rash  ?  Reaction as a child  ? ? ?Current Outpatient Medications on File Prior to Visit  ?Medication Sig Dispense Refill  ? cetirizine (ZYRTEC) 10 MG tablet Take by mouth.    ? chlorthalidone (HYGROTON) 25 MG tablet Take 1 tablet (25 mg total) by mouth daily. For blood pressure. Office visit required for further refills. 90 tablet 0  ? glipiZIDE (GLUCOTROL) 5 MG tablet TAKE 1 TABLET (5 MG TOTAL) BY MOUTH 2 (TWO) TIMES DAILY BEFORE A MEAL. FOR DIABETES. 180 tablet 1  ? glucose blood test strip Use three times daily as directed. 100 each 3  ? losartan (COZAAR) 100 MG tablet Take 1 tablet (100 mg total) by mouth daily. For blood  pressure. Office visit required for further refills. 90 tablet 0  ? metFORMIN (GLUCOPHAGE) 500 MG tablet Take 1 tablet (500 mg total) by mouth 2 (two) times daily with a meal. For diabetes. Office visit required for further refills. 180 tablet 0  ? rosuvastatin (CRESTOR) 5 MG tablet Take 1 tablet (5 mg total) by mouth daily. For cholesterol. Office visit required for further refills. 90 tablet 0  ? ?No current facility-administered medications on file prior to visit.  ? ? ?BP 124/88   Pulse 89   Ht 5\' 3"  (1.6 m)   Wt 255 lb 12.8 oz (116 kg)   LMP 05/20/2010   SpO2 94%   BMI 45.31 kg/m?  ?Objective:  ? Physical Exam ?HENT:  ?   Right  Ear: Tympanic membrane and ear canal normal.  ?   Left Ear: Tympanic membrane and ear canal normal.  ?   Nose: Nose normal.  ?Eyes:  ?   Conjunctiva/sclera: Conjunctivae normal.  ?   Pupils: Pupils are equal, round, and reactive to light.  ?Neck:  ?   Thyroid: No thyromegaly.  ?Cardiovascular:  ?   Rate and Rhythm: Normal rate and regular rhythm.  ?   Heart sounds: No murmur heard. ?Pulmonary:  ?   Effort: Pulmonary effort is normal.  ?   Breath sounds: Normal breath sounds. No rales.  ?Abdominal:  ?   General: Bowel sounds are normal.  ?   Palpations: Abdomen is soft.  ?   Tenderness: There is no abdominal tenderness.  ?Musculoskeletal:     ?   General: Normal range of motion.  ?   Cervical back: Neck supple.  ?Lymphadenopathy:  ?   Cervical: No cervical adenopathy.  ?Skin: ?   General: Skin is warm and dry.  ?   Findings: No rash.  ?Neurological:  ?   Mental Status: She is alert and oriented to person, place, and time.  ?   Cranial Nerves: No cranial nerve deficit.  ?   Deep Tendon Reflexes: Reflexes are normal and symmetric.  ?Psychiatric:     ?   Mood and Affect: Mood normal.  ? ? ? ? ? ?   ?Assessment & Plan:  ? ? ? ? ?This visit occurred during the SARS-CoV-2 public health emergency.  Safety protocols were in place, including screening questions prior to the visit, additional usage of staff PPE, and extensive cleaning of exam room while observing appropriate contact time as indicated for disinfecting solutions.  ?

## 2021-05-24 NOTE — Assessment & Plan Note (Signed)
No recent flares. °Repeat uric acid level pending. °

## 2021-05-24 NOTE — Addendum Note (Signed)
Addended by: Winn Jock on: 05/24/2021 08:47 AM ? ? Modules accepted: Orders ? ?

## 2021-05-24 NOTE — Assessment & Plan Note (Signed)
Compliant to CPAP nightly, continue same.  

## 2021-05-24 NOTE — Assessment & Plan Note (Addendum)
Repeat CMP pending. ? ?She is managed on chlorthalidone which could be contributing.  Already at max dose of ARB, cannot tolerate amlodipine due to peripheral edema. ?

## 2021-05-24 NOTE — Patient Instructions (Addendum)
We changed your metformin and glipizide medications so that you just take them once daily for diabetes. ? ?Stop by the lab prior to leaving today. I will notify you of your results once received.  ? ?Please consider the colonoscopy for colon cancer screening. ? ?Please schedule a follow up visit for 6 months for a diabetes check. ? ?It was a pleasure to see you today! ? ?Preventive Care 60-57 Years Old, Female ?Preventive care refers to lifestyle choices and visits with your health care provider that can promote health and wellness. Preventive care visits are also called wellness exams. ?What can I expect for my preventive care visit? ?Counseling ?Your health care provider may ask you questions about your: ?Medical history, including: ?Past medical problems. ?Family medical history. ?Pregnancy history. ?Current health, including: ?Menstrual cycle. ?Method of birth control. ?Emotional well-being. ?Home life and relationship well-being. ?Sexual activity and sexual health. ?Lifestyle, including: ?Alcohol, nicotine or tobacco, and drug use. ?Access to firearms. ?Diet, exercise, and sleep habits. ?Work and work Statistician. ?Sunscreen use. ?Safety issues such as seatbelt and bike helmet use. ?Physical exam ?Your health care provider will check your: ?Height and weight. These may be used to calculate your BMI (body mass index). BMI is a measurement that tells if you are at a healthy weight. ?Waist circumference. This measures the distance around your waistline. This measurement also tells if you are at a healthy weight and may help predict your risk of certain diseases, such as type 2 diabetes and high blood pressure. ?Heart rate and blood pressure. ?Body temperature. ?Skin for abnormal spots. ?What immunizations do I need? ?Vaccines are usually given at various ages, according to a schedule. Your health care provider will recommend vaccines for you based on your age, medical history, and lifestyle or other factors, such  as travel or where you work. ?What tests do I need? ?Screening ?Your health care provider may recommend screening tests for certain conditions. This may include: ?Lipid and cholesterol levels. ?Diabetes screening. This is done by checking your blood sugar (glucose) after you have not eaten for a while (fasting). ?Pelvic exam and Pap test. ?Hepatitis B test. ?Hepatitis C test. ?HIV (human immunodeficiency virus) test. ?STI (sexually transmitted infection) testing, if you are at risk. ?Lung cancer screening. ?Colorectal cancer screening. ?Mammogram. Talk with your health care provider about when you should start having regular mammograms. This may depend on whether you have a family history of breast cancer. ?BRCA-related cancer screening. This may be done if you have a family history of breast, ovarian, tubal, or peritoneal cancers. ?Bone density scan. This is done to screen for osteoporosis. ?Talk with your health care provider about your test results, treatment options, and if necessary, the need for more tests. ?Follow these instructions at home: ?Eating and drinking ? ?Eat a diet that includes fresh fruits and vegetables, whole grains, lean protein, and low-fat dairy products. ?Take vitamin and mineral supplements as recommended by your health care provider. ?Do not drink alcohol if: ?Your health care provider tells you not to drink. ?You are pregnant, may be pregnant, or are planning to become pregnant. ?If you drink alcohol: ?Limit how much you have to 0-1 drink a day. ?Know how much alcohol is in your drink. In the [place] one drink equals one 12 oz bottle of beer (355 mL), one 5 oz glass of wine (148 mL), or one 1? oz glass of hard liquor (44 mL). ?Lifestyle ?Brush your teeth every morning and night with fluoride toothpaste. Floss one  time each day. ?Exercise for at least 30 minutes 5 or more days each week. ?Do not use any products that contain nicotine or tobacco. These products include cigarettes, chewing  tobacco, and vaping devices, such as e-cigarettes. If you need help quitting, ask your health care provider. ?Do not use drugs. ?If you are sexually active, practice safe sex. Use a condom or other form of protection to prevent STIs. ?If you do not wish to become pregnant, use a form of birth control. If you plan to become pregnant, see your health care provider for a prepregnancy visit. ?Take aspirin only as told by your health care provider. Make sure that you understand how much to take and what form to take. Work with your health care provider to find out whether it is safe and beneficial for you to take aspirin daily. ?Find healthy ways to manage stress, such as: ?Meditation, yoga, or listening to music. ?Journaling. ?Talking to a trusted person. ?Spending time with friends and family. ?Minimize exposure to UV radiation to reduce your risk of skin cancer. ?Safety ?Always wear your seat belt while driving or riding in a vehicle. ?Do not drive: ?If you have been drinking alcohol. Do not ride with someone who has been drinking. ?When you are tired or distracted. ?While texting. ?If you have been using any mind-altering substances or drugs. ?Wear a helmet and other protective equipment during sports activities. ?If you have firearms in your house, make sure you follow all gun safety procedures. ?Seek help if you have been physically or sexually abused. ?What's next? ?Visit your health care provider once a year for an annual wellness visit. ?Ask your health care provider how often you should have your eyes and teeth checked. ?Stay up to date on all vaccines. ?This information is not intended to replace advice given to you by your health care provider. Make sure you discuss any questions you have with your health care provider. ?Document Revised: 08/02/2020 Document Reviewed: 08/02/2020 ?Elsevier Patient Education ? [facility]. ? ?

## 2021-05-24 NOTE — Assessment & Plan Note (Signed)
Repeat lipid panel pending. Continue rosuvastatin 5 mg daily. 

## 2021-05-25 MED ORDER — VITAMIN D (ERGOCALCIFEROL) 1.25 MG (50000 UNIT) PO CAPS: ORAL_CAPSULE | ORAL | 0 refills | Status: DC

## 2021-05-29 MED ORDER — PREDNISONE 20 MG PO TABS: ORAL_TABLET | ORAL | 0 refills | Status: DC

## 2021-05-29 MED ORDER — AMLODIPINE BESYLATE 5 MG PO TABS: 5.0000 mg | ORAL_TABLET | Freq: Every day | ORAL | 0 refills | Status: DC

## 2021-06-14 ENCOUNTER — Other Ambulatory Visit: Payer: Self-pay | Admitting: Primary Care

## 2021-06-14 DIAGNOSIS — E119 Type 2 diabetes mellitus without complications: Secondary | ICD-10-CM

## 2021-06-21 NOTE — Progress Notes (Signed)
Please see the MyChart message reply(ies) for my assessment and plan.  The patient gave consent for this Medical Advice Message and is aware that it may result in a bill to their insurance company as well as the possibility that this may result in a co-payment or deductible. They are an established patient, but are not seeking medical advice exclusively about a problem treated during an in person or video visit in the last 7 days. I did not recommend an in person or video visit within 7 days of my reply.  I spent a total of 6 minutes cumulative time within 7 days through MyChart messaging Quinlan Vollmer K Glessie Eustice, NP  

## 2021-06-24 ENCOUNTER — Other Ambulatory Visit: Payer: Self-pay | Admitting: Primary Care

## 2021-06-24 DIAGNOSIS — I10 Essential (primary) hypertension: Secondary | ICD-10-CM

## 2021-07-01 NOTE — Telephone Encounter (Signed)
Will you call patient? ?See the my chart message I sent her. ?

## 2021-07-04 ENCOUNTER — Encounter: Payer: Self-pay | Admitting: Primary Care

## 2021-07-04 ENCOUNTER — Ambulatory Visit: Payer: BC Managed Care – PPO | Admitting: Primary Care

## 2021-07-04 VITALS — BP 134/92 | HR 96 | Temp 98.0°F | Ht 63.0 in | Wt 256.0 lb

## 2021-07-04 DIAGNOSIS — M109 Gout, unspecified: Secondary | ICD-10-CM | POA: Diagnosis not present

## 2021-07-04 DIAGNOSIS — M1A09X0 Idiopathic chronic gout, multiple sites, without tophus (tophi): Secondary | ICD-10-CM | POA: Diagnosis not present

## 2021-07-04 DIAGNOSIS — I10 Essential (primary) hypertension: Secondary | ICD-10-CM

## 2021-07-04 MED ORDER — PREDNISONE 20 MG PO TABS: ORAL_TABLET | ORAL | 0 refills | Status: DC

## 2021-07-04 MED ORDER — METHYLPREDNISOLONE ACETATE 80 MG/ML IJ SUSP
80.0000 mg | Freq: Once | INTRAMUSCULAR | Status: AC
  Administered 2021-07-04: 80 mg via INTRAMUSCULAR

## 2021-07-04 MED ORDER — COLCHICINE 0.6 MG PO TABS: ORAL_TABLET | ORAL | 0 refills | Status: DC

## 2021-07-04 MED ORDER — OLMESARTAN MEDOXOMIL 40 MG PO TABS: 40.0000 mg | ORAL_TABLET | Freq: Every day | ORAL | 0 refills | Status: DC

## 2021-07-04 NOTE — Assessment & Plan Note (Signed)
Chlorthalidone no longer covered by insurance, HCTZ causes gout flares, amlodipine causes ankle/pedal edema. ? ?Stop losartan 100 mg. ?Start olmesartan 40 mg daily. ? ?We will plan to see her back in a few weeks for blood pressure check. ?Consider addition of carvedilol if needed. ?

## 2021-07-04 NOTE — Progress Notes (Signed)
? ?Subjective:  ? ? Patient ID: Olivia Moreno, female    DOB: 1965/06/15, 56 y.o.   MRN: [id number] ? ?HPI ? ?Olivia Moreno is a very pleasant 56 y.o. female with a history of hypertension, type 2 diabetes, gout, hyperlipidemia, peripheral edema who presents today for follow-up of hypertension and to discuss gout. ? ?1) Essential Hypertension: Currently prescribed amlodipine 5 mg daily, losartan 100 mg daily.  She sent a message via MyChart in April stating that her pharmacist found out that her insurance was no longer covering chlorthalidone. Given this information we initiated amlodipine 5 mg daily and replace with chlorthalidone.  Previously managed on HCTZ, but this caused increased gout flares.   ? ?Since starting amlodipine 5 mg she has noticed return in her pedal edema. We stopped her amlodipine 5 mg two weeks ago and she noticed resolve in her swelling.  She is currently taking losartan 100 mg daily. ? ?BP Readings from Last 3 Encounters:  ?07/04/21 (!) 134/92  ?05/24/21 124/88  ?10/10/20 136/78  ? ?2) Chronic Gout: Acute on chronic with symptoms of inflammation, swelling, warmth to the left elbow for which began four evenings ago.  She ate bacon in the morning prior to her symptom onset. ? ?She's had two gout flares since 2023. She's under a lot of stress. She's no longer on HCTZ or chlorthalidone. ? ?She has been taking ibuprofen with temporary improvement.  Her symptoms today feel like her typical gout flare.  She is not on gout preventative treatment.  Last gout flare was a few months ago, prior to that several years ago. ? ? ? ? ?Review of Systems  ?Respiratory:  Negative for shortness of breath.   ?Cardiovascular:  Negative for chest pain.  ?Musculoskeletal:  Positive for arthralgias.  ?Skin:  Positive for color change. Negative for wound.  ?Neurological:  Negative for headaches.  ? ?   ? ? ?Past Medical History:  ?Diagnosis Date  ? COVID-19 virus infection 01/23/2021  ? Gout   ? Hyperlipidemia   ?  Hypertension   ? ? ?Social History  ? ?Socioeconomic History  ? Marital status: Married  ?  Spouse name: Not on file  ? Number of children: 1  ? Years of education: Not on file  ? Highest education level: Not on file  ?Occupational History  ? Occupation: unemployed  ?  Employer: UNEMPLOYED  ?Tobacco Use  ? Smoking status: Never  ? Smokeless tobacco: Never  ?Substance and Sexual Activity  ? Alcohol use: No  ? Drug use: No  ? Sexual activity: Not on file  ?Other Topics Concern  ? Not on file  ?Social History Narrative  ? Married.  ? 1 child, 1 grandson.  ? 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   ? Enjoys going to the beach.   ? ?Social Determinants of Health  ? ?Financial Resource Strain: Not on file  ?Food Insecurity: Not on file  ?Transportation Needs: Not on file  ?Physical Activity: Not on file  ?Stress: Not on file  ?Social Connections: Not on file  ?Intimate Partner Violence: Not on file  ? ? ?History reviewed. No pertinent surgical history. ? ?Family History  ?Problem Relation Age of Onset  ? Heart attack Mother   ? Pulmonary embolism Father   ? Pulmonary embolism Brother   ? Breast cancer Neg Hx   ? ? ?Allergies  ?Allergen Reactions  ? Penicillins Rash  ?  Reaction as a child  ? ? ?Current  Outpatient Medications on File Prior to Visit  ?Medication Sig Dispense Refill  ? cetirizine (ZYRTEC) 10 MG tablet Take by mouth.    ? glipiZIDE (GLUCOTROL XL) 5 MG 24 hr tablet Take 1 tablet (5 mg total) by mouth daily with breakfast. for diabetes. 90 tablet 1  ? glucose blood test strip Use three times daily as directed. 100 each 3  ? metFORMIN (GLUCOPHAGE-XR) 500 MG 24 hr tablet Take 1 tablet (500 mg total) by mouth daily with breakfast. for diabetes. 90 tablet 1  ? rosuvastatin (CRESTOR) 5 MG tablet Take 1 tablet (5 mg total) by mouth daily. For cholesterol. Office visit required for further refills. 90 tablet 0  ? Vitamin D, Ergocalciferol, (DRISDOL) 1.25 MG (50000 UNIT) CAPS capsule Take 1 capsule by mouth once  weekly for 12 weeks. 12 capsule 0  ? ?No current facility-administered medications on file prior to visit.  ? ? ?BP (!) 134/92   Pulse 96   Temp 98 ?F (36.7 ?C) (Oral)   Ht 5\' 3"  (1.6 m)   Wt 256 lb (116.1 kg)   LMP 05/20/2010   SpO2 98%   BMI 45.35 kg/m?  ?Objective:  ? Physical Exam ?Cardiovascular:  ?   Rate and Rhythm: Normal rate and regular rhythm.  ?Pulmonary:  ?   Effort: Pulmonary effort is normal.  ?   Breath sounds: Normal breath sounds.  ?Musculoskeletal:  ?   Left elbow: Swelling present. Decreased range of motion.  ?   Cervical back: Neck supple.  ?Skin: ?   General: Skin is warm and dry.  ?   Findings: Erythema present.  ?   Comments: Mild warmth to the left elbow  ? ? ? ? ? ?   ?Assessment & Plan:  ? ? ? ? ?This visit occurred during the SARS-CoV-2 public health emergency.  Safety protocols were in place, including screening questions prior to the visit, additional usage of staff PPE, and extensive cleaning of exam room while observing appropriate contact time as indicated for disinfecting solutions.  ?

## 2021-07-04 NOTE — Assessment & Plan Note (Signed)
Acute on chronic flare that was likely caused by bacon consumption as she is no longer on thiazide diuretics. ? ?Depo-Medrol 80 mg IM provided today. ? ?Prescription for prednisone course sent to pharmacy to start tomorrow for acute flare. ?Prescription for colchicine course sent to pharmacy to use as needed in future flares. ? ?Consider allopurinol course for prevention if needed.  She will update if she continues to experience flares. ?

## 2021-07-04 NOTE — Patient Instructions (Signed)
Blood Pressure: ? ?Stop taking losartan 100 mg.  Remain off of amlodipine 5 mg. ? ?Start taking olmesartan 40 mg daily.  Take 1 tablet by mouth once daily for blood pressure. ? ?Gout: ? ?For current flare: Start prednisone tablets tomorrow. Take two tablets my mouth once daily for four days, then one tablet once daily for four days.  ? ?For future flare: Take 2 tablets by mouth at gout onset, repeat with 1 tablet 2 hours later.  Then take 1 tablet twice daily thereafter until gout flare resolves. ? ?Please schedule a follow up visit to meet back with me in 2-3 weeks for blood pressure check.  ? ?It was a pleasure to see you today! ? ? ?

## 2021-07-09 ENCOUNTER — Other Ambulatory Visit: Payer: Self-pay | Admitting: Primary Care

## 2021-07-09 DIAGNOSIS — I10 Essential (primary) hypertension: Secondary | ICD-10-CM

## 2021-07-09 DIAGNOSIS — E782 Mixed hyperlipidemia: Secondary | ICD-10-CM

## 2021-07-20 ENCOUNTER — Ambulatory Visit: Payer: BC Managed Care – PPO | Admitting: Primary Care

## 2021-07-20 ENCOUNTER — Ambulatory Visit (INDEPENDENT_AMBULATORY_CARE_PROVIDER_SITE_OTHER)
Admission: RE | Admit: 2021-07-20 | Discharge: 2021-07-20 | Disposition: A | Discharge status: Home / Self Care | Payer: BC Managed Care – PPO | Source: Ambulatory Visit | Attending: Primary Care | Admitting: Primary Care

## 2021-07-20 ENCOUNTER — Encounter: Payer: Self-pay | Admitting: Primary Care

## 2021-07-20 VITALS — BP 140/86 | HR 68 | Temp 98.3°F | Ht 63.0 in | Wt 254.0 lb

## 2021-07-20 DIAGNOSIS — M25522 Pain in left elbow: Secondary | ICD-10-CM | POA: Diagnosis not present

## 2021-07-20 DIAGNOSIS — I10 Essential (primary) hypertension: Secondary | ICD-10-CM | POA: Diagnosis not present

## 2021-07-20 DIAGNOSIS — M109 Gout, unspecified: Secondary | ICD-10-CM | POA: Diagnosis not present

## 2021-07-20 LAB — BASIC METABOLIC PANEL
BUN: 20 mg/dL (ref 6–23)
CO2: 28 mEq/L (ref 19–32)
Calcium: 10.5 mg/dL (ref 8.4–10.5)
Chloride: 100 mEq/L (ref 96–112)
Creatinine, Ser: 1.04 mg/dL (ref 0.40–1.20)
GFR: 59.98 mL/min — ABNORMAL LOW (ref >60.00)
Glucose, Bld: 222 mg/dL — ABNORMAL HIGH (ref 70–99)
Potassium: 4.3 mEq/L (ref 3.5–5.1)
Sodium: 137 mEq/L (ref 135–145)

## 2021-07-20 LAB — CBC WITH DIFFERENTIAL/PLATELET
Basophils Absolute: 0 10*3/uL (ref 0.0–0.1)
Basophils Relative: 0.4 % (ref 0.0–3.0)
Eosinophils Absolute: 0 10*3/uL (ref 0.0–0.7)
Eosinophils Relative: 0.5 % (ref 0.0–5.0)
HCT: 39.5 % (ref 36.0–46.0)
Hemoglobin: 12.8 g/dL (ref 12.0–15.0)
Lymphocytes Relative: 16.5 % (ref 12.0–46.0)
Lymphs Abs: 1.3 10*3/uL (ref 0.7–4.0)
MCHC: 32.4 g/dL (ref 30.0–36.0)
MCV: 92.4 fl (ref 78.0–100.0)
Monocytes Absolute: 0.6 10*3/uL (ref 0.1–1.0)
Monocytes Relative: 7.2 % (ref 3.0–12.0)
Neutro Abs: 5.9 10*3/uL (ref 1.4–7.7)
Neutrophils Relative %: 75.4 % (ref 43.0–77.0)
Platelets: 137 10*3/uL — ABNORMAL LOW (ref 150.0–400.0)
RBC: 4.28 Mil/uL (ref 3.87–5.11)
RDW: 15.7 % — ABNORMAL HIGH (ref 11.5–15.5)
WBC: 7.8 10*3/uL (ref 4.0–10.5)

## 2021-07-20 LAB — URIC ACID: Uric Acid, Serum: 6.8 mg/dL (ref 2.4–7.0)

## 2021-07-20 MED ORDER — CHLORTHALIDONE 25 MG PO TABS: 25.0000 mg | ORAL_TABLET | Freq: Every day | ORAL | 3 refills | Status: DC

## 2021-07-20 MED ORDER — OLMESARTAN MEDOXOMIL 40 MG PO TABS: 40.0000 mg | ORAL_TABLET | Freq: Every day | ORAL | 3 refills | Status: DC

## 2021-07-20 NOTE — Assessment & Plan Note (Signed)
Uncontrolled.  She would like to resume chlorthalidone as this is cost effective at cash price and was effective for BP control without causing gout flares. Start chlorthalidone 25 mg daily. Continue olmesartan 40 mg daily.  BMP pending.

## 2021-07-20 NOTE — Progress Notes (Signed)
Subjective:    Patient ID: Olivia Moreno, female    DOB: [birth year], 56 y.o.   MRN: [id number]  HPI  Olivia Moreno is a very pleasant 56 y.o. female with a history of hypertension, sleep apnea, type 2 diabetes, gout, decreased renal function who presents today for follow up of hypertension and acute left elbow pain and gout.   Currently managed on olmesartan 40 mg daily. Previously managed on chlorthalidone but insurance will no longer cover. Also managed on HCTZ  in the past but this increased gout flare occurrence. Amlodipine, even at lower doses causes ankle edema. During her last visit we stopped her losartan and initiated olmesartan 40 mg. She is here today for follow up.  Since her last visit she is compliant to her olmesartan 40 mg daily. She's checking her BP at home which is running 150-160's/80's. She has since cut back on fast food, is eating less fried/fatty food.   She continues to notice acute left elbow pain that was significant during her visit a few weeks prior. During her last visit she was experiencing significant pain and moderate swelling to the left elbow without trauma. Symptoms were suspected to be secondary to gout so she was provided Depo Medrol 80 mg IM and prescriptions for prednisone and colchicine.   Since her last visit her pain and swelling have improved, but she continues to notice pain and swelling. Pain is worse with flexion and extension of her elbow, "feels like a pulling". She never took the colchicine. She's not convinced that her lingering pain is secondary to gout.   BP Readings from Last 3 Encounters:  07/20/21 140/86  07/04/21 (!) 134/92  05/24/21 124/88         Review of Systems  Respiratory:  Negative for shortness of breath.   Cardiovascular:  Negative for chest pain.  Musculoskeletal:  Positive for arthralgias and joint swelling.  Skin:  Negative for color change.  Neurological:  Negative for dizziness and headaches.         Past Medical History:  Diagnosis Date   COVID-19 virus infection 01/23/2021   Gout    Hyperlipidemia    Hypertension     Social History   Socioeconomic History   Marital status: Married    Spouse name: Not on file   Number of children: 1   Years of education: Not on file   Highest education level: Not on file  Occupational History   Occupation: unemployed    Employer: UNEMPLOYED  Tobacco Use   Smoking status: Never   Smokeless tobacco: Never  Substance and Sexual Activity   Alcohol use: No   Drug use: No   Sexual activity: Not on file  Other Topics Concern   Not on file  Social History Narrative   Married.   1 child, 1 grandson.   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    Social Determinants of Health   Financial Resource Strain: Not on file  Food Insecurity: Not on file  Transportation Needs: Not on file  Physical Activity: Not on file  Stress: Not on file  Social Connections: Not on file  Intimate Partner Violence: Not on file    History reviewed. No pertinent surgical history.  Family History  Problem Relation Age of Onset   Heart attack Mother    Pulmonary embolism Father    Pulmonary embolism Brother    Breast cancer Neg Hx  Allergies  Allergen Reactions   Penicillins Rash    Reaction as a child    Current Outpatient Medications on File Prior to Visit  Medication Sig Dispense Refill   cetirizine (ZYRTEC) 10 MG tablet Take by mouth.     colchicine 0.6 MG tablet Take 2 tablets by mouth at gout onset, repeat with 1 tablet 2 hours later.  Then take 1 tablet twice daily thereafter until gout flare resolves. 60 tablet 0   glipiZIDE (GLUCOTROL XL) 5 MG 24 hr tablet Take 1 tablet (5 mg total) by mouth daily with breakfast. for diabetes. 90 tablet 1   glucose blood test strip Use three times daily as directed. 100 each 3   metFORMIN (GLUCOPHAGE-XR) 500 MG 24 hr tablet Take 1 tablet (500 mg total) by mouth  daily with breakfast. for diabetes. 90 tablet 1   rosuvastatin (CRESTOR) 5 MG tablet TAKE 1 TABLET BY MOUTH DAILY. FOR CHOLESTEROL. OFFICE VISIT REQUIRED FOR FURTHER REFILLS. 90 tablet 3   Vitamin D, Ergocalciferol, (DRISDOL) 1.25 MG (50000 UNIT) CAPS capsule Take 1 capsule by mouth once weekly for 12 weeks. 12 capsule 0   No current facility-administered medications on file prior to visit.    BP 140/86   Pulse 68   Temp 98.3 F (36.8 C) (Oral)   Ht 5\' 3"  (1.6 m)   Wt 254 lb (115.2 kg)   LMP 05/20/2010   SpO2 96%   BMI 44.99 kg/m  Objective:   Physical Exam Constitutional:      General: She is not in acute distress. Cardiovascular:     Rate and Rhythm: Normal rate and regular rhythm.  Pulmonary:     Effort: Pulmonary effort is normal.     Breath sounds: Normal breath sounds.  Musculoskeletal:     Left elbow: Swelling present. Decreased range of motion. No tenderness.     Comments: Mild swelling to left elbow.  Mild decrease in ROM with pain to extension and flexion of elbow.  No erythema or warmth.          Assessment & Plan:

## 2021-07-20 NOTE — Patient Instructions (Signed)
Resume Chlorthalidone 25 mg once daily for blood pressure.  Continue olmesartan 40 mg daily for blood pressure.  Stop by the lab and xray prior to leaving today. I will notify you of your results once received.    Your blood pressure should run less than 130 on top and less than 90 on bottom.   It was a pleasure to see you today!

## 2021-07-20 NOTE — Assessment & Plan Note (Signed)
Suspect residual elbow pain is from gout.  Uric acid level, CBC pending. Checking xray of the left elbow.  Low suspicion for septic joint. Will rule out.

## 2021-07-22 DIAGNOSIS — M109 Gout, unspecified: Secondary | ICD-10-CM

## 2021-07-22 MED ORDER — PREDNISONE 20 MG PO TABS: ORAL_TABLET | ORAL | 0 refills | Status: DC

## 2021-07-25 ENCOUNTER — Ambulatory Visit: Payer: BC Managed Care – PPO | Admitting: Primary Care

## 2021-08-05 ENCOUNTER — Other Ambulatory Visit: Payer: Self-pay | Admitting: Primary Care

## 2021-08-05 DIAGNOSIS — M1A09X0 Idiopathic chronic gout, multiple sites, without tophus (tophi): Secondary | ICD-10-CM

## 2021-08-10 LAB — HM DIABETES EYE EXAM

## 2021-08-12 ENCOUNTER — Other Ambulatory Visit: Payer: Self-pay | Admitting: Primary Care

## 2021-08-12 DIAGNOSIS — E559 Vitamin D deficiency, unspecified: Secondary | ICD-10-CM

## 2021-08-15 ENCOUNTER — Encounter: Payer: Self-pay | Admitting: Primary Care

## 2021-10-31 ENCOUNTER — Other Ambulatory Visit: Payer: Self-pay | Admitting: Primary Care

## 2021-10-31 DIAGNOSIS — M1A09X0 Idiopathic chronic gout, multiple sites, without tophus (tophi): Secondary | ICD-10-CM

## 2021-10-31 DIAGNOSIS — I10 Essential (primary) hypertension: Secondary | ICD-10-CM

## 2021-11-11 ENCOUNTER — Other Ambulatory Visit: Payer: Self-pay | Admitting: Primary Care

## 2021-11-11 DIAGNOSIS — E559 Vitamin D deficiency, unspecified: Secondary | ICD-10-CM

## 2021-11-21 ENCOUNTER — Other Ambulatory Visit: Payer: Self-pay | Admitting: Primary Care

## 2021-11-21 DIAGNOSIS — E1165 Type 2 diabetes mellitus with hyperglycemia: Secondary | ICD-10-CM

## 2021-11-25 ENCOUNTER — Other Ambulatory Visit: Payer: Self-pay | Admitting: Primary Care

## 2021-11-25 DIAGNOSIS — M1A09X0 Idiopathic chronic gout, multiple sites, without tophus (tophi): Secondary | ICD-10-CM

## 2021-11-27 ENCOUNTER — Ambulatory Visit: Payer: BC Managed Care – PPO | Admitting: Primary Care

## 2022-01-02 ENCOUNTER — Other Ambulatory Visit: Payer: Self-pay | Admitting: Primary Care

## 2022-01-02 DIAGNOSIS — Z1231 Encounter for screening mammogram for malignant neoplasm of breast: Secondary | ICD-10-CM

## 2022-01-03 ENCOUNTER — Telehealth (INDEPENDENT_AMBULATORY_CARE_PROVIDER_SITE_OTHER): Payer: BC Managed Care – PPO | Admitting: Primary Care

## 2022-01-03 ENCOUNTER — Encounter: Payer: Self-pay | Admitting: Primary Care

## 2022-01-03 DIAGNOSIS — J069 Acute upper respiratory infection, unspecified: Secondary | ICD-10-CM

## 2022-01-03 MED ORDER — PREDNISONE 20 MG PO TABS: ORAL_TABLET | ORAL | 0 refills | Status: DC

## 2022-01-03 NOTE — Assessment & Plan Note (Signed)
Symptoms suggestive of viral etiology at this point. We discussed that symptoms typically worsen before improving.  We discussed options for treatment and given her sinus/nasal congestion we will treat with prednisone course.  Start prednisone 20 mg tablets. Take 2 tablets by mouth once daily in the morning for 5 days.  She will update next week if no improvement.

## 2022-01-03 NOTE — Telephone Encounter (Signed)
Had a cancellation on our scheduled for today. Scheduled this patient for 3:00 as a virtual.

## 2022-01-03 NOTE — Progress Notes (Signed)
Patient ID: Olivia Moreno, female    DOB: 05-29-1965, 56 y.o.   MRN: [id number]  Virtual visit completed through [employer], a video enabled telemedicine application. Due to national recommendations of social distancing due to COVID-19, a virtual visit is felt to be most appropriate for this patient at this time. Reviewed limitations, risks, security and privacy concerns of performing a virtual visit and the availability of in person appointments. I also reviewed that there may be a patient responsible charge related to this service. The patient agreed to proceed.   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    LMP 05/20/2010    CC: Nasal congestion  Subjective:   HPI: Olivia Moreno is a 56 y.o. female with a history of type 2 diabetes, sleep apnea, hypertension presenting on 01/03/2022 for Nasal Congestion (Started 11/11 /Cough, congestion, headaches/Has not tested for covid at home)  Symptom onset five days ago with head congestion, nasal congestion, sinus pressure. She then developed a cough. Symptoms have progressed since.   She's been taking Elderberry OTC, vitamin C, Flonase. She's been around her grandson who has had similar symptoms. She has not tested for Covid-19 infection. Her grandson tested negative for Covid-19.   She denies fevers, SOB. She was out of work on Monday this week as she was feeling so bad. She is at work today.       Relevant past medical, surgical, family and social history reviewed and updated as indicated. Interim medical history since our last visit reviewed. Allergies and medications reviewed and updated. Outpatient Medications Prior to Visit  Medication Sig Dispense Refill   chlorthalidone (HYGROTON) 25 MG tablet Take 1 tablet (25 mg total) by mouth daily.  for blood pressure. 90 tablet 3   colchicine 0.6 MG tablet TAKE 2 TABLETS BY MOUTH AT GOUT ONSET, REPEAT WITH 1 TABLET 2 HOURS LATER. THEN TAKE 1 TABLET TWICE DAILY THEREAFTER UNTIL GOUT FLARE RESOLVES. 30 tablet 0   glipiZIDE (GLUCOTROL XL) 5 MG 24 hr tablet Take 1 tablet (5 mg total) by mouth daily with breakfast. for diabetes. Office visit required for further refills. 90 tablet 0   glucose blood test strip Use three times daily as directed. 100 each 3   metFORMIN (GLUCOPHAGE-XR) 500 MG 24 hr tablet Take 1 tablet (500 mg total) by mouth daily with breakfast. for diabetes. Office visit required for further refills. 90 tablet 0   olmesartan (BENICAR) 40 MG tablet Take 1 tablet (40 mg total) by mouth daily. for blood pressure. 90 tablet 3   rosuvastatin (CRESTOR) 5 MG tablet TAKE 1 TABLET BY MOUTH DAILY. FOR CHOLESTEROL. OFFICE VISIT REQUIRED FOR FURTHER REFILLS. 90 tablet 3   Vitamin D, Ergocalciferol, (DRISDOL) 1.25 MG (50000 UNIT) CAPS capsule TAKE 1 CAPSULE BY MOUTH ONCE WEEKLY FOR 12 WEEKS. 12 capsule 0   cetirizine (ZYRTEC) 10 MG tablet Take by mouth. (Patient not taking: Reported on 01/03/2022)     predniSONE (DELTASONE) 20 MG tablet Take two tablets my mouth once daily for four days, then one tablet once daily for four days. (Patient not taking: Reported on 01/03/2022) 12 tablet 0   No facility-administered medications prior to visit.     Per HPI unless specifically indicated in ROS section below Review of Systems  Constitutional:  Negative for fever.  HENT:  Positive for  congestion and sinus pressure.   Respiratory:  Positive for cough. Negative for shortness of breath.   Neurological:  Positive for headaches.   Objective:  LMP 05/20/2010   Wt Readings from Last 3 Encounters:  07/20/21 254 lb (115.2 kg)  07/04/21 256 lb (116.1 kg)  05/24/21 255 lb 12.8 oz (116 kg)       Physical exam: General: Alert and oriented x 3, no distress, does not appear sickly  Pulmonary: Speaks in  complete sentences without increased work of breathing, no cough during visit.  Psychiatric: Normal mood, thought content, and behavior.     Results for orders placed or performed in visit on 08/15/21  HM DIABETES EYE EXAM  Result Value Ref Range   HM Diabetic Eye Exam No Retinopathy No Retinopathy   Assessment & Plan:   Problem List Items Addressed This Visit       Respiratory   Viral URI with cough - Primary    Symptoms suggestive of viral etiology at this point. We discussed that symptoms typically worsen before improving.  We discussed options for treatment and given her sinus/nasal congestion we will treat with prednisone course.  Start prednisone 20 mg tablets. Take 2 tablets by mouth once daily in the morning for 5 days.  She will update next week if no improvement.        Relevant Medications   predniSONE (DELTASONE) 20 MG tablet     Meds ordered this encounter  Medications   DISCONTD: predniSONE (DELTASONE) 20 MG tablet    Sig: Take 2 tablets by mouth once daily in the morning for 5 days.    Dispense:  12 tablet    Refill:  0    Order Specific Question:   Supervising Provider    Answer:   BEDSOLE, AMY E [[id number]]   predniSONE (DELTASONE) 20 MG tablet    Sig: Take 2 tablets by mouth once daily in the morning for 5 days.    Dispense:  10 tablet    Refill:  0    Order Specific Question:   Supervising Provider    Answer:   BEDSOLE, AMY E [[id number]]   No orders of the defined types were placed in this encounter.   I discussed the assessment and treatment plan with the patient. The patient was provided an opportunity to ask questions and all were answered. The patient agreed with the plan and demonstrated an understanding of the instructions. The patient was advised to call back or seek an in-person evaluation if the symptoms worsen or if the condition fails to improve as anticipated.  Follow up plan:  Start prednisone 20 mg tablets. Take 2 tablets by mouth once daily  in the morning for 5 days.  Please update me Tuesday next week if you are not improving.  It was a pleasure to see you today!   Pleas Koch, NP

## 2022-01-03 NOTE — Patient Instructions (Signed)
Start prednisone 20 mg tablets. Take 2 tablets by mouth once daily in the morning for 5 days.  Please update me Tuesday next week if you are not improving.  It was a pleasure to see you today!

## 2022-01-16 ENCOUNTER — Other Ambulatory Visit: Payer: Self-pay | Admitting: Primary Care

## 2022-01-16 ENCOUNTER — Ambulatory Visit
Admission: RE | Admit: 2022-01-16 | Discharge: 2022-01-16 | Disposition: A | Discharge status: Home / Self Care | Payer: BC Managed Care – PPO | Source: Ambulatory Visit | Attending: Primary Care | Admitting: Primary Care

## 2022-01-16 DIAGNOSIS — Z1231 Encounter for screening mammogram for malignant neoplasm of breast: Secondary | ICD-10-CM | POA: Diagnosis present

## 2022-01-16 DIAGNOSIS — R928 Other abnormal and inconclusive findings on diagnostic imaging of breast: Secondary | ICD-10-CM

## 2022-01-16 DIAGNOSIS — R921 Mammographic calcification found on diagnostic imaging of breast: Secondary | ICD-10-CM

## 2022-01-18 ENCOUNTER — Ambulatory Visit: Payer: BC Managed Care – PPO | Admitting: Primary Care

## 2022-01-18 VITALS — BP 128/84 | HR 80 | Temp 97.5°F | Ht 63.0 in | Wt 256.8 lb

## 2022-01-18 DIAGNOSIS — E1165 Type 2 diabetes mellitus with hyperglycemia: Secondary | ICD-10-CM

## 2022-01-18 LAB — POCT GLYCOSYLATED HEMOGLOBIN (HGB A1C): Hemoglobin A1C: 7.7 % — AB (ref 4.0–5.6)

## 2022-01-18 LAB — MICROALBUMIN / CREATININE URINE RATIO
Creatinine,U: 89.6 mg/dL
Microalb Creat Ratio: 2.2 mg/g (ref 0.0–30.0)
Microalb, Ur: 2 mg/dL — ABNORMAL HIGH (ref 0.0–1.9)

## 2022-01-18 MED ORDER — OZEMPIC (0.25 OR 0.5 MG/DOSE) 2 MG/3ML ~~LOC~~ SOPN: PEN_INJECTOR | SUBCUTANEOUS | 0 refills | Status: DC

## 2022-01-18 NOTE — Progress Notes (Signed)
Subjective:    Patient ID: Olivia Moreno, female    DOB: May 25, 1965, 56 y.o.   MRN: [id number]  Diabetes Pertinent negatives for diabetes include no chest pain.    Olivia Moreno is a very pleasant 56 y.o. female with a history of type 2 diabetes, hypertension, hyperlipidemia who presents today for follow up of diabetes.  Current medications include: Glipizide XL 5 mg daily, metformin XR 500 mg daily.   She would like to try Ozempic for weight loss purposes.   Last A1C: 6.6 in April 2023, 7.7 today Last Eye Exam: UTD Last Foot Exam: Due Pneumonia Vaccination: 2019 Urine Microalbumin:  Due Statin: rosuvastatin  Diet currently consists of:  Breakfast: Protein Shake Lunch: Take out food, salad Dinner: Home cooked meals, protein, vegetables. Take out food three days weekly (tries to make better choices).  Snacks: None Desserts: Daily (chocolate) Beverages: Water mostly   Exercise: None    BP Readings from Last 3 Encounters:  01/18/22 128/84  07/20/21 140/86  07/04/21 (!) 134/92     Wt Readings from Last 3 Encounters:  01/18/22 256 lb 12.8 oz (116.5 kg)  07/20/21 254 lb (115.2 kg)  07/04/21 256 lb (116.1 kg)     Review of Systems  Respiratory:  Negative for shortness of breath.   Cardiovascular:  Negative for chest pain.  Gastrointestinal:  Negative for abdominal pain and nausea.  Neurological:  Negative for numbness.         Past Medical History:  Diagnosis Date   COVID-19 virus infection 01/23/2021   Gout    Hyperlipidemia    Hypertension     Social History   Socioeconomic History   Marital status: Married    Spouse name: Not on file   Number of children: 1   Years of education: Not on file   Highest education level: Not on file  Occupational History   Occupation: unemployed    Employer: UNEMPLOYED  Tobacco Use   Smoking status: Never   Smokeless tobacco: Never  Substance and Sexual Activity   Alcohol use: No   Drug use: No   Sexual  activity: Not on file  Other Topics Concern   Not on file  Social History Narrative   Married.   1 child, 1 grandson.   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    Social Determinants of Health   Financial Resource Strain: Not on file  Food Insecurity: Not on file  Transportation Needs: Not on file  Physical Activity: Not on file  Stress: Not on file  Social Connections: Not on file  Intimate Partner Violence: Not on file    No past surgical history on file.  Family History  Problem Relation Age of Onset   Heart attack Mother    Pulmonary embolism Father    Pulmonary embolism Brother    Breast cancer Neg Hx     Allergies  Allergen Reactions   Penicillins Rash    Reaction as a child    Current Outpatient Medications on File Prior to Visit  Medication Sig Dispense Refill   chlorthalidone (HYGROTON) 25 MG tablet Take 1 tablet (25 mg total) by mouth daily. for blood pressure. 90 tablet 3   colchicine 0.6 MG tablet TAKE 2 TABLETS BY MOUTH AT GOUT ONSET, REPEAT WITH 1 TABLET 2 HOURS LATER. THEN TAKE 1 TABLET TWICE DAILY THEREAFTER UNTIL GOUT FLARE RESOLVES. 30 tablet 0   glipiZIDE (GLUCOTROL XL)  5 MG 24 hr tablet Take 1 tablet (5 mg total) by mouth daily with breakfast. for diabetes. Office visit required for further refills. 90 tablet 0   metFORMIN (GLUCOPHAGE-XR) 500 MG 24 hr tablet Take 1 tablet (500 mg total) by mouth daily with breakfast. for diabetes. Office visit required for further refills. 90 tablet 0   olmesartan (BENICAR) 40 MG tablet Take 1 tablet (40 mg total) by mouth daily. for blood pressure. 90 tablet 3   rosuvastatin (CRESTOR) 5 MG tablet TAKE 1 TABLET BY MOUTH DAILY. FOR CHOLESTEROL. OFFICE VISIT REQUIRED FOR FURTHER REFILLS. 90 tablet 3   cetirizine (ZYRTEC) 10 MG tablet Take by mouth. As needed     glucose blood test strip Use three times daily as directed. (Patient not taking: Reported on 01/18/2022) 100 each 3   Vitamin  D, Ergocalciferol, (DRISDOL) 1.25 MG (50000 UNIT) CAPS capsule TAKE 1 CAPSULE BY MOUTH ONCE WEEKLY FOR 12 WEEKS. (Patient not taking: Reported on 01/18/2022) 12 capsule 0   No current facility-administered medications on file prior to visit.    BP 128/84   Pulse 80   Temp (!) 97.5 F (36.4 C) (Oral)   Ht 5\' 3"  (1.6 m)   Wt 256 lb 12.8 oz (116.5 kg)   LMP 05/20/2010   SpO2 97%   BMI 45.49 kg/m  Objective:   Physical Exam Cardiovascular:     Rate and Rhythm: Normal rate and regular rhythm.  Pulmonary:     Effort: Pulmonary effort is normal.     Breath sounds: Normal breath sounds.  Musculoskeletal:     Cervical back: Neck supple.  Skin:    General: Skin is warm and dry.  Psychiatric:        Mood and Affect: Mood normal.           Assessment & Plan:   Problem List Items Addressed This Visit       Endocrine   Type 2 diabetes mellitus with hyperglycemia (HCC) - Primary    Worse with A1C of 7.7 today.  Discussed her diet, recommended she cut back on take out food, sweets, and fast food salads.  Increase exercise.   Continue Glipizide XL 5 mg daily, metformin XR 500 mg daily. Add Ozempic 0.25 mg weekly x 4 weeks, then increase to 0.5 mg weekly thereafter.  Foot exam today.  Follow up in 3 months.       Relevant Medications   Semaglutide,0.25 or 0.5MG /DOS, (OZEMPIC, 0.25 OR 0.5 MG/DOSE,) 2 MG/3ML SOPN   Other Relevant Orders   POCT glycosylated hemoglobin (Hb A1C) (Completed)   Microalbumin / creatinine urine ratio       07/20/2010, NP

## 2022-01-18 NOTE — Assessment & Plan Note (Signed)
Worse with A1C of 7.7 today.  Discussed her diet, recommended she cut back on take out food, sweets, and fast food salads.  Increase exercise.   Continue Glipizide XL 5 mg daily, metformin XR 500 mg daily. Add Ozempic 0.25 mg weekly x 4 weeks, then increase to 0.5 mg weekly thereafter.  Foot exam today.  Follow up in 3 months.

## 2022-01-18 NOTE — Patient Instructions (Addendum)
Stop by the lab prior to leaving today. I will notify you of your results once received.   Start Ozempic 0.25 mg weekly x 4 weeks, then increase to 0.5 mg weekly thereafter.  Please schedule a follow up visit for 3 months.  It was a pleasure to see you today!

## 2022-01-23 ENCOUNTER — Other Ambulatory Visit: Payer: Self-pay | Admitting: Primary Care

## 2022-01-23 ENCOUNTER — Ambulatory Visit
Admission: RE | Admit: 2022-01-23 | Discharge: 2022-01-23 | Disposition: A | Discharge status: Home / Self Care | Payer: BC Managed Care – PPO | Source: Ambulatory Visit | Attending: Primary Care | Admitting: Primary Care

## 2022-01-23 DIAGNOSIS — R928 Other abnormal and inconclusive findings on diagnostic imaging of breast: Secondary | ICD-10-CM

## 2022-01-23 DIAGNOSIS — R921 Mammographic calcification found on diagnostic imaging of breast: Secondary | ICD-10-CM | POA: Diagnosis present

## 2022-02-04 ENCOUNTER — Telehealth: Payer: BC Managed Care – PPO | Admitting: Family Medicine

## 2022-02-04 DIAGNOSIS — B9689 Other specified bacterial agents as the cause of diseases classified elsewhere: Secondary | ICD-10-CM | POA: Diagnosis not present

## 2022-02-04 DIAGNOSIS — J019 Acute sinusitis, unspecified: Secondary | ICD-10-CM | POA: Diagnosis not present

## 2022-02-04 MED ORDER — DOXYCYCLINE HYCLATE 100 MG PO TABS: 100.0000 mg | ORAL_TABLET | Freq: Two times a day (BID) | ORAL | 0 refills | Status: AC

## 2022-02-04 NOTE — Progress Notes (Signed)

## 2022-02-06 ENCOUNTER — Ambulatory Visit
Admission: RE | Admit: 2022-02-06 | Discharge: 2022-02-06 | Disposition: A | Discharge status: Home / Self Care | Payer: BC Managed Care – PPO | Source: Ambulatory Visit | Attending: Primary Care | Admitting: Primary Care

## 2022-02-06 DIAGNOSIS — R921 Mammographic calcification found on diagnostic imaging of breast: Secondary | ICD-10-CM | POA: Diagnosis present

## 2022-02-06 DIAGNOSIS — R928 Other abnormal and inconclusive findings on diagnostic imaging of breast: Secondary | ICD-10-CM | POA: Diagnosis present

## 2022-02-06 HISTORY — PX: BREAST BIOPSY: SHX20

## 2022-02-06 MED ORDER — LIDOCAINE HCL (PF) 1 % IJ SOLN
20.0000 mL | Freq: Once | INTRAMUSCULAR | Status: AC
  Administered 2022-02-06: 20 mL
  Filled 2022-02-06: qty 20

## 2022-02-06 MED ORDER — LIDOCAINE-EPINEPHRINE 1 %-1:100000 IJ SOLN
10.0000 mL | Freq: Once | INTRAMUSCULAR | Status: AC
  Administered 2022-02-06: 10 mL
  Filled 2022-02-06: qty 10

## 2022-02-07 LAB — SURGICAL PATHOLOGY

## 2022-02-20 ENCOUNTER — Other Ambulatory Visit: Payer: Self-pay | Admitting: Primary Care

## 2022-02-20 DIAGNOSIS — E1165 Type 2 diabetes mellitus with hyperglycemia: Secondary | ICD-10-CM

## 2022-04-10 ENCOUNTER — Encounter: Payer: Self-pay | Admitting: Primary Care

## 2022-04-10 ENCOUNTER — Ambulatory Visit: Payer: BC Managed Care – PPO | Admitting: Primary Care

## 2022-04-10 VITALS — BP 98/64 | HR 100 | Temp 97.9°F | Ht 63.0 in | Wt 248.0 lb

## 2022-04-10 DIAGNOSIS — J0100 Acute maxillary sinusitis, unspecified: Secondary | ICD-10-CM | POA: Insufficient documentation

## 2022-04-10 LAB — POC COVID19 BINAXNOW: SARS Coronavirus 2 Ag: NEGATIVE

## 2022-04-10 MED ORDER — AZITHROMYCIN 250 MG PO TABS: ORAL_TABLET | ORAL | 0 refills | Status: DC

## 2022-04-10 NOTE — Progress Notes (Signed)
Subjective:    Patient ID: Olivia Moreno, female    DOB: 04/03/1965, 57 y.o.   MRN: [id number]  Cough Pertinent negatives include no chills, fever, headaches, sore throat or shortness of breath.    Olivia Moreno is a very pleasant 57 y.o. female with a history of hypertension, sleep apnea, type 2 diabetes, gout exertional dyspnea who presents today to discuss cough.   Symptom onset 03/25/22 with nasal congestion. She then developed cough, right sided facial tenderness, fatigue chest congestion. She lost her ability to smell for a few days, this has since returned to baseline. Currently, she continues to experience right sided facial pressure, rhinorrhea, ear fullness, and cough.   She has not taken an Covid-19 test. She denies body aches, chills, fevers. She's taken Sudafed, Mucinex, Claritin with temporary improvement. Overall she's feeling about the same.   Review of Systems  Constitutional:  Positive for fatigue. Negative for chills and fever.  HENT:  Positive for congestion, sinus pressure and sinus pain. Negative for sore throat.   Respiratory:  Positive for cough. Negative for shortness of breath.   Neurological:  Negative for headaches.         Past Medical History:  Diagnosis Date   COVID-19 virus infection 01/23/2021   Gout    Hyperlipidemia    Hypertension     Social History   Socioeconomic History   Marital status: Married    Spouse name: Not on file   Number of children: 1   Years of education: Not on file   Highest education level: Not on file  Occupational History   Occupation: unemployed    Employer: UNEMPLOYED  Tobacco Use   Smoking status: Never   Smokeless tobacco: Never  Substance and Sexual Activity   Alcohol use: No   Drug use: No   Sexual activity: Not on file  Other Topics Concern   Not on file  Social History Narrative   Married.   1 child, 1 grandson.   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     Social Determinants of Health   Financial Resource Strain: Not on file  Food Insecurity: Not on file  Transportation Needs: Not on file  Physical Activity: Not on file  Stress: Not on file  Social Connections: Not on file  Intimate Partner Violence: Not on file    Past Surgical History:  Procedure Laterality Date   BREAST BIOPSY Right 02/06/2022   Right Breast Stereo Bx, X Clip - path pending   BREAST BIOPSY Right 02/06/2022   MM RT BREAST BX W LOC DEV 1ST LESION IMAGE BX SPEC STEREO GUIDE 02/06/2022 [facility]    Family History  Problem Relation Age of Onset   Heart attack Mother    Pulmonary embolism Father    Pulmonary embolism Brother    Breast cancer Neg Hx     Allergies  Allergen Reactions   Penicillins Rash    Reaction as a child    Current Outpatient Medications on File Prior to Visit  Medication Sig Dispense Refill   chlorthalidone (HYGROTON) 25 MG tablet Take 1 tablet (25 mg total) by mouth daily. for blood pressure. 90 tablet 3   colchicine 0.6 MG tablet TAKE 2 TABLETS BY MOUTH AT GOUT ONSET, REPEAT WITH 1 TABLET 2 HOURS LATER. THEN TAKE 1 TABLET TWICE DAILY THEREAFTER UNTIL GOUT FLARE RESOLVES. 30 tablet 0   glipiZIDE (GLUCOTROL XL) 5 MG 24 hr tablet  Take 1 tablet (5 mg total) by mouth daily with breakfast. for diabetes. 90 tablet 0   glucose blood test strip Use three times daily as directed. 100 each 3   metFORMIN (GLUCOPHAGE-XR) 500 MG 24 hr tablet Take 1 tablet (500 mg total) by mouth daily with breakfast. for diabetes. 90 tablet 0   olmesartan (BENICAR) 40 MG tablet Take 1 tablet (40 mg total) by mouth daily. for blood pressure. 90 tablet 3   rosuvastatin (CRESTOR) 5 MG tablet TAKE 1 TABLET BY MOUTH DAILY. FOR CHOLESTEROL. OFFICE VISIT REQUIRED FOR FURTHER REFILLS. 90 tablet 3   Semaglutide,0.25 or 0.5MG/DOS, (OZEMPIC, 0.25 OR 0.5 MG/DOSE,) 2 MG/3ML SOPN Inject 0.25 mg into the skin once weekly x 4 weeks, then increase to 0.5 mg once weekly  thereafter for diabetes. 9 mL 0   cetirizine (ZYRTEC) 10 MG tablet Take by mouth. As needed (Patient not taking: Reported on 04/10/2022)     Vitamin D, Ergocalciferol, (DRISDOL) 1.25 MG (50000 UNIT) CAPS capsule TAKE 1 CAPSULE BY MOUTH ONCE WEEKLY FOR 12 WEEKS. (Patient not taking: Reported on 01/18/2022) 12 capsule 0   No current facility-administered medications on file prior to visit.    BP 98/64   Pulse 100   Temp 97.9 F (36.6 C) (Temporal)   Ht 5' 3"$  (1.6 m)   Wt 248 lb (112.5 kg)   LMP 05/20/2010   SpO2 99%   BMI 43.93 kg/m  Objective:   Physical Exam Constitutional:      Appearance: She is ill-appearing.  HENT:     Right Ear: Tympanic membrane and ear canal normal.     Left Ear: Tympanic membrane and ear canal normal.     Nose:     Right Sinus: Maxillary sinus tenderness present. No frontal sinus tenderness.     Left Sinus: No maxillary sinus tenderness or frontal sinus tenderness.     Mouth/Throat:     Pharynx: No posterior oropharyngeal erythema.  Eyes:     Conjunctiva/sclera: Conjunctivae normal.  Cardiovascular:     Rate and Rhythm: Normal rate and regular rhythm.  Pulmonary:     Effort: Pulmonary effort is normal.     Breath sounds: Normal breath sounds. No wheezing or rales.  Musculoskeletal:     Cervical back: Neck supple.  Lymphadenopathy:     Cervical: No cervical adenopathy.  Skin:    General: Skin is warm and dry.           Assessment & Plan:  Acute non-recurrent maxillary sinusitis Assessment & Plan: Symptoms and presentation today suggestive of sinusitis.  Given duration of symptoms, coupled with presentation, will treat. Rx for Azithromycin course sent to pharmacy. PCN allergy.  Discussed use of Flonase. Follow up PRN.  Orders: -     Azithromycin; Take 2 tablets by mouth today, then 1 tablet daily for 4 additional days.  Dispense: 6 tablet; Refill: 0        Pleas Koch, NP

## 2022-04-10 NOTE — Addendum Note (Signed)
Addended by: Pat Kocher on: 04/10/2022 07:59 AM   Modules accepted: Orders

## 2022-04-10 NOTE — Patient Instructions (Signed)
Start Azithromycin antibiotics for infection. Take 2 tablets by mouth today, then 1 tablet daily for 4 additional days.  Nasal Congestion/Ear Pressure/Sinus Pressure: Try using Flonase (fluticasone) nasal spray. Instill 1 spray in each nostril twice daily.   It was a pleasure to see you today!

## 2022-04-10 NOTE — Assessment & Plan Note (Signed)
Symptoms and presentation today suggestive of sinusitis.  Given duration of symptoms, coupled with presentation, will treat. Rx for Azithromycin course sent to pharmacy. PCN allergy.  Discussed use of Flonase. Follow up PRN.

## 2022-04-19 ENCOUNTER — Ambulatory Visit: Payer: BC Managed Care – PPO | Admitting: Primary Care

## 2022-04-19 ENCOUNTER — Encounter: Payer: Self-pay | Admitting: Primary Care

## 2022-04-19 ENCOUNTER — Other Ambulatory Visit: Payer: Self-pay | Admitting: Primary Care

## 2022-04-19 VITALS — BP 114/76 | HR 88 | Temp 97.2°F | Ht 63.0 in | Wt 249.0 lb

## 2022-04-19 DIAGNOSIS — E559 Vitamin D deficiency, unspecified: Secondary | ICD-10-CM

## 2022-04-19 DIAGNOSIS — I10 Essential (primary) hypertension: Secondary | ICD-10-CM

## 2022-04-19 DIAGNOSIS — E7800 Pure hypercholesterolemia, unspecified: Secondary | ICD-10-CM | POA: Diagnosis not present

## 2022-04-19 DIAGNOSIS — E1165 Type 2 diabetes mellitus with hyperglycemia: Secondary | ICD-10-CM

## 2022-04-19 DIAGNOSIS — M1A4710 Other secondary chronic gout, right ankle and foot, without tophus (tophi): Secondary | ICD-10-CM | POA: Diagnosis not present

## 2022-04-19 LAB — IBC + FERRITIN
Ferritin: 155.2 ng/mL (ref 10.0–291.0)
Iron: 77 ug/dL (ref 42–145)
Saturation Ratios: 22 % (ref 20.0–50.0)
TIBC: 350 ug/dL (ref 250.0–450.0)
Transferrin: 250 mg/dL (ref 212.0–360.0)

## 2022-04-19 LAB — CBC
HCT: 38.2 % (ref 36.0–46.0)
Hemoglobin: 12.7 g/dL (ref 12.0–15.0)
MCHC: 33.2 g/dL (ref 30.0–36.0)
MCV: 91.5 fl (ref 78.0–100.0)
Platelets: 193 10*3/uL (ref 150.0–400.0)
RBC: 4.18 Mil/uL (ref 3.87–5.11)
RDW: 14.3 % (ref 11.5–15.5)
WBC: 5.8 10*3/uL (ref 4.0–10.5)

## 2022-04-19 LAB — LIPID PANEL
Cholesterol: 112 mg/dL (ref 0–200)
HDL: 46.1 mg/dL (ref >39.00)
LDL Cholesterol: 45 mg/dL (ref 0–99)
NonHDL: 65.87
Total CHOL/HDL Ratio: 2
Triglycerides: 102 mg/dL (ref 0.0–149.0)
VLDL: 20.4 mg/dL (ref 0.0–40.0)

## 2022-04-19 LAB — COMPREHENSIVE METABOLIC PANEL
ALT: 18 U/L (ref 0–35)
AST: 20 U/L (ref 0–37)
Albumin: 4.2 g/dL (ref 3.5–5.2)
Alkaline Phosphatase: 71 U/L (ref 39–117)
BUN: 31 mg/dL — ABNORMAL HIGH (ref 6–23)
CO2: 27 mEq/L (ref 19–32)
Calcium: 10.1 mg/dL (ref 8.4–10.5)
Chloride: 106 mEq/L (ref 96–112)
Creatinine, Ser: 1.28 mg/dL — ABNORMAL HIGH (ref 0.40–1.20)
GFR: 46.51 mL/min — ABNORMAL LOW (ref >60.00)
Glucose, Bld: 122 mg/dL — ABNORMAL HIGH (ref 70–99)
Potassium: 4.7 mEq/L (ref 3.5–5.1)
Sodium: 142 mEq/L (ref 135–145)
Total Bilirubin: 0.6 mg/dL (ref 0.2–1.2)
Total Protein: 7.1 g/dL (ref 6.0–8.3)

## 2022-04-19 LAB — URIC ACID: Uric Acid, Serum: 11.4 mg/dL — ABNORMAL HIGH (ref 2.4–7.0)

## 2022-04-19 LAB — VITAMIN D 25 HYDROXY (VIT D DEFICIENCY, FRACTURES): VITD: 17.67 ng/mL — ABNORMAL LOW (ref 30.00–100.00)

## 2022-04-19 LAB — POCT GLYCOSYLATED HEMOGLOBIN (HGB A1C): Hemoglobin A1C: 6.1 % — AB (ref 4.0–5.6)

## 2022-04-19 MED ORDER — VITAMIN D (ERGOCALCIFEROL) 1.25 MG (50000 UNIT) PO CAPS: ORAL_CAPSULE | ORAL | 0 refills | Status: DC

## 2022-04-19 NOTE — Progress Notes (Signed)
Subjective:    Patient ID: [name], female    DOB: 04-17-1965, 57 y.o.   MRN: [id number]  HPI  Olivia Moreno is a very pleasant 57 y.o. female with a history of type 2 diabetes, hyperlipidemia, gout, hypertension who presents today for follow up of diabetes and chronic conditions.  1) Type 2 Diabetes:   Current medications include: Glipizide XL 5 mg daily, metformin XR 500 mg daily, Ozempic 0.5 mg weekly.  She is checking her blood glucose 0 times daily.  Last A1C: 7.7 in December 2023, 6.1 today Last Eye Exam: UTD Last Foot Exam: UTD Pneumonia Vaccination: 2019 Urine Microalbumin: UTD Statin: Rosuvastatin   Dietary changes since last visit: Curbed appetite. Eating more salads, cutting back on fast food.   Exercise: None. Active at work.   2) Hypertension: Currently managed on chlorthalidone 25 mg daily, olmesartan 40 mg daily. She denies chest pain, headaches.   BP Readings from Last 3 Encounters:  04/19/22 114/76  04/10/22 98/64  01/18/22 128/84   3) Hyperlipidemia: Currently managed on rosuvastatin 5 mg daily. She is due for repeat lipid panel today.  4) Chronic Gout: Currently managed on colchicine 0.6 mg daily PRN. No recent gout flares.   Wt Readings from Last 3 Encounters:  04/19/22 249 lb (112.9 kg)  04/10/22 248 lb (112.5 kg)  01/18/22 256 lb 12.8 oz (116.5 kg)      Review of Systems  Gastrointestinal:  Negative for constipation and nausea.  Neurological:  Negative for dizziness and headaches.         Past Medical History:  Diagnosis Date   COVID-19 virus infection 01/23/2021   Gout    Hyperlipidemia    Hypertension     Social History   Socioeconomic History   Marital status: Married    Spouse name: Not on file   Number of children: 1   Years of education: Not on file   Highest education level: Not on file  Occupational History   Occupation: unemployed    Employer: UNEMPLOYED  Tobacco Use   Smoking status: Never    Smokeless tobacco: Never  Substance and Sexual Activity   Alcohol use: No   Drug use: No   Sexual activity: Not on file  Other Topics Concern   Not on file  Social History Narrative   Married.   1 child, 1 grandson.   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    Social Determinants of Health   Financial Resource Strain: Not on file  Food Insecurity: Not on file  Transportation Needs: Not on file  Physical Activity: Not on file  Stress: Not on file  Social Connections: Not on file  Intimate Partner Violence: Not on file    Past Surgical History:  Procedure Laterality Date   BREAST BIOPSY Right 02/06/2022   Right Breast Stereo Bx, X Clip - path pending   BREAST BIOPSY Right 02/06/2022   MM RT BREAST BX W LOC DEV 1ST LESION IMAGE BX SPEC STEREO GUIDE 02/06/2022 [facility]    Family History  Problem Relation Age of Onset   Heart attack Mother    Pulmonary embolism Father    Pulmonary embolism Brother    Breast cancer Neg Hx     Allergies  Allergen Reactions   Penicillins Rash    Reaction as a child    Current Outpatient Medications on File Prior to Visit  Medication Sig Dispense Refill  cetirizine (ZYRTEC) 10 MG tablet Take by mouth. As needed     chlorthalidone (HYGROTON) 25 MG tablet Take 1 tablet (25 mg total) by mouth daily. for blood pressure. 90 tablet 3   colchicine 0.6 MG tablet TAKE 2 TABLETS BY MOUTH AT GOUT ONSET, REPEAT WITH 1 TABLET 2 HOURS LATER. THEN TAKE 1 TABLET TWICE DAILY THEREAFTER UNTIL GOUT FLARE RESOLVES. 30 tablet 0   glucose blood test strip Use three times daily as directed. 100 each 3   metFORMIN (GLUCOPHAGE-XR) 500 MG 24 hr tablet Take 1 tablet (500 mg total) by mouth daily with breakfast. for diabetes. 90 tablet 0   olmesartan (BENICAR) 40 MG tablet Take 1 tablet (40 mg total) by mouth daily. for blood pressure. 90 tablet 3   rosuvastatin (CRESTOR) 5 MG tablet TAKE 1 TABLET BY MOUTH DAILY. FOR  CHOLESTEROL. OFFICE VISIT REQUIRED FOR FURTHER REFILLS. 90 tablet 3   Semaglutide,0.25 or 0.'5MG'$ /DOS, (OZEMPIC, 0.25 OR 0.5 MG/DOSE,) 2 MG/3ML SOPN Inject 0.25 mg into the skin once weekly x 4 weeks, then increase to 0.5 mg once weekly thereafter for diabetes. 9 mL 0   No current facility-administered medications on file prior to visit.    BP 114/76   Pulse 88   Temp (!) 97.2 F (36.2 C) (Temporal)   Ht '5\' 3"'$  (1.6 m)   Wt 249 lb (112.9 kg)   LMP 05/20/2010   SpO2 97%   BMI 44.11 kg/m  Objective:   Physical Exam Cardiovascular:     Rate and Rhythm: Normal rate and regular rhythm.  Pulmonary:     Effort: Pulmonary effort is normal.     Breath sounds: Normal breath sounds.  Abdominal:     General: Bowel sounds are normal.     Palpations: Abdomen is soft.  Musculoskeletal:     Cervical back: Neck supple.  Skin:    General: Skin is warm and dry.  Psychiatric:        Mood and Affect: Mood normal.           Assessment & Plan:  Type 2 diabetes mellitus with hyperglycemia, without long-term current use of insulin (HCC) Assessment & Plan: Improved with A1C today of 6.1!!  We agree to maximize her opportunity for weight loss with Ozempic. Increase Ozempic to 1 mg weekly. She will notify when she uses her last 0.5 mg pen.  Stop Glipizide XL 5 mg. Continue metformin XR 500 mg daily.  Follow up in 3 months.  Orders: -     POCT glycosylated hemoglobin (Hb A1C)  Vitamin D deficiency Assessment & Plan: No recent OTC use of vitamin D. Repeat vitamin D level pending.  Orders: -     VITAMIN D 25 Hydroxy (Vit-D Deficiency, Fractures)  Primary hypertension Assessment & Plan: Controlled.  Continue chlorthalidone 25 mg daily and olmesartan 40 mg daily. CMP pending.  Orders: -     CBC -     Comprehensive metabolic panel -     IBC + Ferritin  Pure hypercholesterolemia Assessment & Plan: Continue rosuvastatin 5 mg daily. Repeat lipid panel pending.  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  Continue colchicine 0.6 mg PRN  Orders: -     Uric acid        Pleas Koch, NP

## 2022-04-19 NOTE — Assessment & Plan Note (Signed)
Controlled.  Continue chlorthalidone 25 mg daily and olmesartan 40 mg daily. CMP pending.

## 2022-04-19 NOTE — Assessment & Plan Note (Signed)
No recent OTC use of vitamin D. Repeat vitamin D level pending.

## 2022-04-19 NOTE — Assessment & Plan Note (Signed)
Improved with A1C today of 6.1!!  We agree to maximize her opportunity for weight loss with Ozempic. Increase Ozempic to 1 mg weekly. She will notify when she uses her last 0.5 mg pen.  Stop Glipizide XL 5 mg. Continue metformin XR 500 mg daily.  Follow up in 3 months.

## 2022-04-19 NOTE — Patient Instructions (Signed)
We increased your Ozempic to 1 mg weekly. Please notify me when you use your last 0.5 mg pen.  Stop Glipizide for diabetes. Continue metformin.  Please schedule a physical to meet with me in 3 months.   It was a pleasure to see you today!

## 2022-04-19 NOTE — Assessment & Plan Note (Signed)
Repeat uric acid level pending No recent gout flares.  Continue colchicine 0.6 mg PRN

## 2022-04-19 NOTE — Assessment & Plan Note (Signed)
Continue rosuvastatin 5 mg daily. Repeat lipid panel pending 

## 2022-04-21 DIAGNOSIS — E1165 Type 2 diabetes mellitus with hyperglycemia: Secondary | ICD-10-CM

## 2022-04-21 DIAGNOSIS — M1A4710 Other secondary chronic gout, right ankle and foot, without tophus (tophi): Secondary | ICD-10-CM

## 2022-04-21 DIAGNOSIS — N289 Disorder of kidney and ureter, unspecified: Secondary | ICD-10-CM

## 2022-04-22 ENCOUNTER — Encounter: Payer: Self-pay | Admitting: *Deleted

## 2022-04-22 MED ORDER — ALLOPURINOL 100 MG PO TABS: 50.0000 mg | ORAL_TABLET | Freq: Every day | ORAL | 0 refills | Status: DC

## 2022-05-01 MED ORDER — SEMAGLUTIDE (1 MG/DOSE) 4 MG/3ML ~~LOC~~ SOPN: 1.0000 mg | PEN_INJECTOR | SUBCUTANEOUS | 0 refills | Status: DC

## 2022-05-02 ENCOUNTER — Ambulatory Visit
Admission: RE | Admit: 2022-05-02 | Discharge: 2022-05-02 | Disposition: A | Discharge status: Home / Self Care | Payer: BC Managed Care – PPO | Source: Ambulatory Visit | Attending: Primary Care | Admitting: Primary Care

## 2022-05-02 DIAGNOSIS — N289 Disorder of kidney and ureter, unspecified: Secondary | ICD-10-CM | POA: Insufficient documentation

## 2022-05-18 ENCOUNTER — Other Ambulatory Visit: Payer: Self-pay | Admitting: Primary Care

## 2022-05-18 DIAGNOSIS — E1165 Type 2 diabetes mellitus with hyperglycemia: Secondary | ICD-10-CM

## 2022-05-21 ENCOUNTER — Other Ambulatory Visit: Payer: Self-pay | Admitting: Primary Care

## 2022-05-21 DIAGNOSIS — I10 Essential (primary) hypertension: Secondary | ICD-10-CM

## 2022-05-22 ENCOUNTER — Other Ambulatory Visit: Payer: Self-pay | Admitting: Primary Care

## 2022-05-22 DIAGNOSIS — E782 Mixed hyperlipidemia: Secondary | ICD-10-CM

## 2022-05-22 NOTE — Telephone Encounter (Signed)
Called and spoke with pharmacy they stated there was some confusion with the medications being transferred to a different pharmacy and then transferred back. She does have pills left on file with them for all three medication and pharmacist advised he would get a 30 day supply ready of all three.   Called and advised patient of this.

## 2022-05-22 NOTE — Telephone Encounter (Signed)
Olivia Moreno, please call her pharmacy to find out what's going on with her Rx's. She has a 1 year supply of each of the listed medications from June 2023.

## 2022-06-19 ENCOUNTER — Other Ambulatory Visit: Payer: Self-pay | Admitting: Primary Care

## 2022-06-19 DIAGNOSIS — I10 Essential (primary) hypertension: Secondary | ICD-10-CM

## 2022-06-19 DIAGNOSIS — E782 Mixed hyperlipidemia: Secondary | ICD-10-CM

## 2022-07-01 ENCOUNTER — Other Ambulatory Visit: Payer: Self-pay | Admitting: Primary Care

## 2022-07-01 DIAGNOSIS — E559 Vitamin D deficiency, unspecified: Secondary | ICD-10-CM

## 2022-07-18 ENCOUNTER — Other Ambulatory Visit: Payer: Self-pay | Admitting: Primary Care

## 2022-07-18 DIAGNOSIS — I10 Essential (primary) hypertension: Secondary | ICD-10-CM

## 2022-07-21 ENCOUNTER — Other Ambulatory Visit: Payer: Self-pay | Admitting: Primary Care

## 2022-07-21 DIAGNOSIS — M1A4710 Other secondary chronic gout, right ankle and foot, without tophus (tophi): Secondary | ICD-10-CM

## 2022-07-23 ENCOUNTER — Encounter: Payer: Self-pay | Admitting: Primary Care

## 2022-07-23 ENCOUNTER — Ambulatory Visit (INDEPENDENT_AMBULATORY_CARE_PROVIDER_SITE_OTHER): Payer: BC Managed Care – PPO | Admitting: Primary Care

## 2022-07-23 VITALS — BP 124/86 | HR 93 | Temp 97.0°F | Ht 63.0 in | Wt 247.0 lb

## 2022-07-23 DIAGNOSIS — Z0000 Encounter for general adult medical examination without abnormal findings: Secondary | ICD-10-CM

## 2022-07-23 DIAGNOSIS — M1A4710 Other secondary chronic gout, right ankle and foot, without tophus (tophi): Secondary | ICD-10-CM | POA: Diagnosis not present

## 2022-07-23 DIAGNOSIS — N289 Disorder of kidney and ureter, unspecified: Secondary | ICD-10-CM | POA: Diagnosis not present

## 2022-07-23 DIAGNOSIS — E7800 Pure hypercholesterolemia, unspecified: Secondary | ICD-10-CM

## 2022-07-23 DIAGNOSIS — Z7985 Long-term (current) use of injectable non-insulin antidiabetic drugs: Secondary | ICD-10-CM

## 2022-07-23 DIAGNOSIS — I10 Essential (primary) hypertension: Secondary | ICD-10-CM

## 2022-07-23 DIAGNOSIS — G4733 Obstructive sleep apnea (adult) (pediatric): Secondary | ICD-10-CM | POA: Diagnosis not present

## 2022-07-23 DIAGNOSIS — E1165 Type 2 diabetes mellitus with hyperglycemia: Secondary | ICD-10-CM

## 2022-07-23 DIAGNOSIS — Z7984 Long term (current) use of oral hypoglycemic drugs: Secondary | ICD-10-CM

## 2022-07-23 LAB — BASIC METABOLIC PANEL
BUN: 32 mg/dL — ABNORMAL HIGH (ref 6–23)
CO2: 28 mEq/L (ref 19–32)
Calcium: 9.9 mg/dL (ref 8.4–10.5)
Chloride: 102 mEq/L (ref 96–112)
Creatinine, Ser: 1.27 mg/dL — ABNORMAL HIGH (ref 0.40–1.20)
GFR: 46.86 mL/min — ABNORMAL LOW (ref >60.00)
Glucose, Bld: 106 mg/dL — ABNORMAL HIGH (ref 70–99)
Potassium: 4.6 mEq/L (ref 3.5–5.1)
Sodium: 138 mEq/L (ref 135–145)

## 2022-07-23 LAB — URIC ACID: Uric Acid, Serum: 7.6 mg/dL — ABNORMAL HIGH (ref 2.4–7.0)

## 2022-07-23 MED ORDER — OZEMPIC (2 MG/DOSE) 8 MG/3ML ~~LOC~~ SOPN: 2.0000 mg | PEN_INJECTOR | SUBCUTANEOUS | 1 refills | Status: DC

## 2022-07-23 MED ORDER — FREESTYLE LIBRE 3 SENSOR MISC: 1 refills | Status: DC

## 2022-07-23 MED ORDER — CHLORTHALIDONE 25 MG PO TABS: 25.0000 mg | ORAL_TABLET | Freq: Every day | ORAL | 3 refills | Status: DC

## 2022-07-23 NOTE — Assessment & Plan Note (Signed)
Controlled.  Continue CPAP nightly.

## 2022-07-23 NOTE — Assessment & Plan Note (Signed)
Controlled.  Continue chlorthalidone 25 mg daily, olmesartan 40 mg daily. BMP pending

## 2022-07-23 NOTE — Assessment & Plan Note (Signed)
Repeat lipid panel pending.  Commended her on regular exercise. Continue rosuvastatin 5 mg daily.

## 2022-07-23 NOTE — Assessment & Plan Note (Signed)
Immunizations UTD. Pap smear UTD. Mammogram UTD Colonoscopy overdue, she continues to decline despite recomendations  Discussed the importance of a healthy diet and regular exercise in order for weight loss, and to reduce the risk of further co-morbidity.  Exam stable. Labs pending.  Follow up in 1 year for repeat physical.

## 2022-07-23 NOTE — Progress Notes (Signed)
Subjective:    Patient ID: Olivia Moreno, female    DOB: [birth year], 57 y.o.   MRN: [id number]  HPI  Olivia Moreno is a very pleasant 57 y.o. female who presents today for complete physical and follow up of chronic conditions.  She would like to increase her dose of Ozempic to 2 mg weekly.   Immunizations: -Tetanus: Completed in 2023 -Shingles: Completed Shingrix series -Pneumonia: Completed in 2019  Diet: Fair diet.  Exercise: Exercising three days weekly  Eye exam: Completes annually  Dental exam: Completes semi-annually    Pap Smear: 2022 Mammogram: December 2023  Colonoscopy: Never completed, declined last year. Declines again this year.   BP Readings from Last 3 Encounters:  07/23/22 124/86  04/19/22 114/76  04/10/22 98/64   Wt Readings from Last 3 Encounters:  07/23/22 247 lb (112 kg)  04/19/22 249 lb (112.9 kg)  04/10/22 248 lb (112.5 kg)       Review of Systems  Constitutional:  Negative for unexpected weight change.  HENT:  Positive for congestion and postnasal drip.   Respiratory:  Positive for cough. Negative for shortness of breath.   Cardiovascular:  Negative for chest pain.  Gastrointestinal:  Negative for constipation and diarrhea.  Genitourinary:  Negative for difficulty urinating.  Musculoskeletal:  Positive for myalgias. Negative for arthralgias.       Left buttock pain since beginning regular exercise  Skin:  Negative for rash.  Allergic/Immunologic: Positive for environmental allergies.  Neurological:  Negative for dizziness and headaches.  Psychiatric/Behavioral:  The patient is not nervous/anxious.          Past Medical History:  Diagnosis Date   COVID-19 virus infection 01/23/2021   Gout    Hyperlipidemia    Hypertension     Social History   Socioeconomic History   Marital status: Married    Spouse name: Not on file   Number of children: 1   Years of education: Not on file   Highest education level: Not on file   Occupational History   Occupation: unemployed    Employer: UNEMPLOYED  Tobacco Use   Smoking status: Never   Smokeless tobacco: Never  Substance and Sexual Activity   Alcohol use: No   Drug use: No   Sexual activity: Not on file  Other Topics Concern   Not on file  Social History Narrative   Married.   1 child, 1 grandson.   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    Social Determinants of Health   Financial Resource Strain: Not on file  Food Insecurity: Not on file  Transportation Needs: Not on file  Physical Activity: Not on file  Stress: Not on file  Social Connections: Not on file  Intimate Partner Violence: Not on file    Past Surgical History:  Procedure Laterality Date   BREAST BIOPSY Right 02/06/2022   Right Breast Stereo Bx, X Clip - path pending   BREAST BIOPSY Right 02/06/2022   MM RT BREAST BX W LOC DEV 1ST LESION IMAGE BX SPEC STEREO GUIDE 02/06/2022 [facility]    Family History  Problem Relation Age of Onset   Heart attack Mother    Pulmonary embolism Father    Pulmonary embolism Brother    Breast cancer Neg Hx     Allergies  Allergen Reactions   Penicillins Rash    Reaction as a child    Current Outpatient Medications on File Prior  to Visit  Medication Sig Dispense Refill   allopurinol (ZYLOPRIM) 100 MG tablet TAKE 1/2 TABLET(50 MG TOTAL) BY MOUTH DAILY. FOR GOUT PREVENTION 45 tablet 0   cetirizine (ZYRTEC) 10 MG tablet Take by mouth. As needed     colchicine 0.6 MG tablet TAKE 2 TABLETS BY MOUTH AT GOUT ONSET, REPEAT WITH 1 TABLET 2 HOURS LATER. THEN TAKE 1 TABLET TWICE DAILY THEREAFTER UNTIL GOUT FLARE RESOLVES. 30 tablet 0   metFORMIN (GLUCOPHAGE-XR) 500 MG 24 hr tablet TAKE 1 TABLET (500 MG TOTAL) BY MOUTH DAILY WITH BREAKFAST. FOR DIABETES. 90 tablet 0   olmesartan (BENICAR) 40 MG tablet TAKE 1 TABLET (40 MG TOTAL) BY MOUTH DAILY FOR BLOOD PRESSURE 90 tablet 2   rosuvastatin (CRESTOR) 5 MG tablet  Take 1 tablet (5 mg total) by mouth daily. for cholesterol. 90 tablet 0   Vitamin D, Ergocalciferol, (DRISDOL) 1.25 MG (50000 UNIT) CAPS capsule TAKE 1 CAPSULE BY MOUTH ONCE WEEKLY FOR 12 WEEKS. 12 capsule 0   No current facility-administered medications on file prior to visit.    BP 124/86   Pulse 93   Temp (!) 97 F (36.1 C) (Temporal)   Ht 5\' 3"  (1.6 m)   Wt 247 lb (112 kg)   LMP 05/20/2010   SpO2 97%   BMI 43.75 kg/m  Objective:   Physical Exam HENT:     Right Ear: Tympanic membrane and ear canal normal.     Left Ear: Tympanic membrane and ear canal normal.     Nose: Nose normal.  Eyes:     Conjunctiva/sclera: Conjunctivae normal.     Pupils: Pupils are equal, round, and reactive to light.  Neck:     Thyroid: No thyromegaly.  Cardiovascular:     Rate and Rhythm: Normal rate and regular rhythm.     Heart sounds: No murmur heard. Pulmonary:     Effort: Pulmonary effort is normal.     Breath sounds: Normal breath sounds. No rales.  Abdominal:     General: Bowel sounds are normal.     Palpations: Abdomen is soft.     Tenderness: There is no abdominal tenderness.  Musculoskeletal:        General: Normal range of motion.     Cervical back: Neck supple.  Lymphadenopathy:     Cervical: No cervical adenopathy.  Skin:    General: Skin is warm and dry.     Findings: No rash.  Neurological:     Mental Status: She is alert and oriented to person, place, and time.     Cranial Nerves: No cranial nerve deficit.     Deep Tendon Reflexes: Reflexes are normal and symmetric.  Psychiatric:        Mood and Affect: Mood normal.           Assessment & Plan:  Sleep apnea, obstructive Assessment & Plan: Controlled.  Continue CPAP nightly.    Primary hypertension Assessment & Plan: Controlled.  Continue chlorthalidone 25 mg daily, olmesartan 40 mg daily. BMP pending  Orders: -     Chlorthalidone; Take 1 tablet (25 mg total) by mouth daily. for blood pressure.   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 No recent flares.  Continue allopurinol 50 mg daily for prevention and colchicine 0.6 mg PRN.  Orders: -     Uric acid  Decreased renal function Assessment &  Plan: Reviewed renal US today.  Repeat BMP pending.  Orders: -     Basic metabolic panel  Type 2 diabetes mellitus with hyperglycemia, without long-term current use of insulin (HCC) Assessment & Plan: Repeat A1C pending.  Commended her on regular exercise, encouraged to continue.  Continue metformin XR 500 mg daily. Increase Ozempic to 2 mg weekly for weight loss benefits.   Follow up in 3-6 months based on A1C result  Orders: -     FreeStyle Libre 3 Sensor; Place 1 sensor on the skin every 14 days. Use to check glucose continuously  Dispense: 6 each; Refill: 1 -     Ozempic (2 MG/DOSE); Inject 2 mg into the skin once a week. for diabetes.  Dispense: 9 mL; Refill: 1  Pure hypercholesterolemia Assessment & Plan: Repeat lipid panel pending.  Commended her on regular exercise. Continue rosuvastatin 5 mg daily.    Preventative health care Assessment & Plan: Immunizations UTD. Pap smear UTD. Mammogram UTD Colonoscopy overdue, she continues to decline despite recomendations  Discussed the importance of a healthy diet and regular exercise in order for weight loss, and to reduce the risk of further co-morbidity.  Exam stable. Labs pending.  Follow up in 1 year for repeat physical.          Doreene Nest, NP

## 2022-07-23 NOTE — Assessment & Plan Note (Signed)
Repeat uric acid level pending. No recent flares.  Continue allopurinol 50 mg daily for prevention and colchicine 0.6 mg PRN.

## 2022-07-23 NOTE — Assessment & Plan Note (Signed)
Repeat A1C pending.  Commended her on regular exercise, encouraged to continue.  Continue metformin XR 500 mg daily. Increase Ozempic to 2 mg weekly for weight loss benefits.   Follow up in 3-6 months based on A1C result

## 2022-07-23 NOTE — Patient Instructions (Signed)
Stop by the lab prior to leaving today. I will notify you of your results once received.   Google some stretching exercises for Piriformis syndrome.   It was a pleasure to see you today!

## 2022-07-23 NOTE — Assessment & Plan Note (Signed)
Reviewed renal US today.  Repeat BMP pending.

## 2022-08-14 ENCOUNTER — Other Ambulatory Visit: Payer: Self-pay | Admitting: Primary Care

## 2022-08-14 DIAGNOSIS — E1165 Type 2 diabetes mellitus with hyperglycemia: Secondary | ICD-10-CM

## 2022-08-14 NOTE — Telephone Encounter (Signed)
Patient is due for diabetes follow up in November. Please schedule, thank you!

## 2022-08-15 NOTE — Telephone Encounter (Signed)
Patient scheduled.

## 2022-09-20 ENCOUNTER — Other Ambulatory Visit: Payer: Self-pay | Admitting: Primary Care

## 2022-09-20 DIAGNOSIS — E782 Mixed hyperlipidemia: Secondary | ICD-10-CM

## 2022-09-20 LAB — HM DIABETES EYE EXAM

## 2022-09-26 ENCOUNTER — Other Ambulatory Visit: Payer: Self-pay | Admitting: Primary Care

## 2022-09-26 DIAGNOSIS — E559 Vitamin D deficiency, unspecified: Secondary | ICD-10-CM

## 2022-09-26 NOTE — Telephone Encounter (Signed)
Please call patient:  Needs lab only appointment scheduled to repeat vitamin D level. Once we receive her level we will know our next step regarding her vitamin D regimen.

## 2022-10-20 ENCOUNTER — Other Ambulatory Visit: Payer: Self-pay | Admitting: Primary Care

## 2022-10-20 DIAGNOSIS — M1A4710 Other secondary chronic gout, right ankle and foot, without tophus (tophi): Secondary | ICD-10-CM

## 2022-11-04 ENCOUNTER — Other Ambulatory Visit: Payer: Self-pay | Admitting: Primary Care

## 2022-11-04 DIAGNOSIS — E559 Vitamin D deficiency, unspecified: Secondary | ICD-10-CM

## 2022-11-14 ENCOUNTER — Other Ambulatory Visit: Payer: Self-pay | Admitting: Primary Care

## 2022-11-14 DIAGNOSIS — E1165 Type 2 diabetes mellitus with hyperglycemia: Secondary | ICD-10-CM

## 2022-12-24 ENCOUNTER — Ambulatory Visit: Payer: BC Managed Care – PPO | Admitting: Primary Care

## 2022-12-24 VITALS — BP 130/80 | HR 92 | Temp 97.6°F | Ht 63.0 in | Wt 244.6 lb

## 2022-12-24 DIAGNOSIS — Z23 Encounter for immunization: Secondary | ICD-10-CM

## 2022-12-24 DIAGNOSIS — Z7984 Long term (current) use of oral hypoglycemic drugs: Secondary | ICD-10-CM

## 2022-12-24 DIAGNOSIS — Z7985 Long-term (current) use of injectable non-insulin antidiabetic drugs: Secondary | ICD-10-CM

## 2022-12-24 DIAGNOSIS — E1165 Type 2 diabetes mellitus with hyperglycemia: Secondary | ICD-10-CM | POA: Diagnosis not present

## 2022-12-24 LAB — POCT GLYCOSYLATED HEMOGLOBIN (HGB A1C): Hemoglobin A1C: 5.9 % — AB (ref 4.0–5.6)

## 2022-12-24 LAB — MICROALBUMIN / CREATININE URINE RATIO
Creatinine,U: 136.5 mg/dL
Microalb Creat Ratio: 2.9 mg/g (ref 0.0–30.0)
Microalb, Ur: 3.9 mg/dL — ABNORMAL HIGH (ref 0.0–1.9)

## 2022-12-24 NOTE — Assessment & Plan Note (Signed)
Improved and controlled with A1C today of 5.9.  Continue metformin XR 500 mg daily, Ozempic 2 mg weekly.   Urine microalbumin due and pending.  Follow up in 6 months.

## 2022-12-24 NOTE — Patient Instructions (Addendum)
Continue metformin and Ozempic for diabetes.   Schedule your annual physical for June 2025  It was a pleasure to see you today!

## 2022-12-24 NOTE — Progress Notes (Signed)
Subjective:    Patient ID: Olivia Moreno, female    DOB: 28-Apr-1965, 57 y.o.   MRN: [id number]  HPI  Olivia Moreno is a very pleasant 57 y.o. female with a history of hypertension, type 2 diabetes, sleep apnea, hyperlipidemia, gout who presents today for follow-up of diabetes.  Current medications include: Metformin XR 500 mg daily, semaglutide 2 mg weekly  She is checking her blood glucose continuously and is getting readings ranging low to mid 100's.   Last A1C: 6.1 in March 2024, 5.9 today Last Eye Exam: Up-to-date Last Foot Exam: Up-to-date Pneumonia Vaccination: Completed in 2019 Urine Microalbumin: Due Statin: Rosuvastatin  Dietary changes since last visit: Reduced fast food and take out food, smaller portions.    Exercise: No recent exercise due to chronic arthritis.    Wt Readings from Last 3 Encounters:  12/24/22 244 lb 9.6 oz (110.9 kg)  07/23/22 247 lb (112 kg)  04/19/22 249 lb (112.9 kg)   BP Readings from Last 3 Encounters:  12/24/22 130/80  07/23/22 124/86  04/19/22 114/76      Review of Systems  Respiratory:  Negative for shortness of breath.   Cardiovascular:  Negative for chest pain.  Musculoskeletal:  Positive for arthralgias.  Neurological:  Negative for numbness.         Past Medical History:  Diagnosis Date   COVID-19 virus infection 01/23/2021   Gout    Hyperlipidemia    Hypertension     Social History   Socioeconomic History   Marital status: Married    Spouse name: Not on file   Number of children: 1   Years of education: Not on file   Highest education level: Some college, no degree  Occupational History   Occupation: unemployed    Associate Professor: UNEMPLOYED  Tobacco Use   Smoking status: Never   Smokeless tobacco: Never  Substance and Sexual Activity   Alcohol use: No   Drug use: No   Sexual activity: Not on file  Other Topics Concern   Not on file  Social History Narrative   Married.   1 child, 1 grandson.    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    Social Determinants of Health   Financial Resource Strain: Low Risk  (12/24/2022)   Overall Financial Resource Strain (CARDIA)    Difficulty of Paying Living Expenses: Not hard at all  Food Insecurity: No Food Insecurity (12/24/2022)   Hunger Vital Sign    Worried About Running Out of Food in the Last Year: Never true    Ran Out of Food in the Last Year: Never true  Transportation Needs: No Transportation Needs (12/24/2022)   PRAPARE - Administrator, Civil Service (Medical): No    Lack of Transportation (Non-Medical): No  Physical Activity: Insufficiently Active (12/24/2022)   Exercise Vital Sign    Days of Exercise per Week: 1 day    Minutes of Exercise per Session: 30 min  Stress: No Stress Concern Present (12/24/2022)   [employer] of Occupational Health - Occupational Stress Questionnaire    Feeling of Stress : Not at all  Social Connections: Moderately Integrated (12/24/2022)   Social Connection and Isolation Panel [NHANES]    Frequency of Communication with Friends and Family: Three times a week    Frequency of Social Gatherings with Friends and Family: Once a week    Attends Religious Services: 1 to 4 times per  year    Active Member of Clubs or Organizations: No    Attends Banker Meetings: Not on file    Marital Status: Married  Catering manager Violence: Not on file    Past Surgical History:  Procedure Laterality Date   BREAST BIOPSY Right 02/06/2022   Right Breast Stereo Bx, X Clip - path pending   BREAST BIOPSY Right 02/06/2022   MM RT BREAST BX W LOC DEV 1ST LESION IMAGE BX SPEC STEREO GUIDE 02/06/2022 [facility]    Family History  Problem Relation Age of Onset   Heart attack Mother    Pulmonary embolism Father    Pulmonary embolism Brother    Breast cancer Neg Hx     Allergies  Allergen Reactions   Penicillins Rash    Reaction as a child     Current Outpatient Medications on File Prior to Visit  Medication Sig Dispense Refill   allopurinol (ZYLOPRIM) 100 MG tablet TAKE 1/2 TABLET(50 MG TOTAL) BY MOUTH DAILY. FOR GOUT PREVENTION 45 tablet 2   cetirizine (ZYRTEC) 10 MG tablet Take by mouth. As needed     chlorthalidone (HYGROTON) 25 MG tablet Take 1 tablet (25 mg total) by mouth daily. for blood pressure. 90 tablet 3   colchicine 0.6 MG tablet TAKE 2 TABLETS BY MOUTH AT GOUT ONSET, REPEAT WITH 1 TABLET 2 HOURS LATER. THEN TAKE 1 TABLET TWICE DAILY THEREAFTER UNTIL GOUT FLARE RESOLVES. 30 tablet 0   Continuous Glucose Sensor (FREESTYLE LIBRE 3 SENSOR) MISC Place 1 sensor on the skin every 14 days. Use to check glucose continuously 6 each 1   metFORMIN (GLUCOPHAGE-XR) 500 MG 24 hr tablet TAKE 1 TABLET (500 MG TOTAL) BY MOUTH DAILY WITH BREAKFAST. FOR DIABETES. 90 tablet 0   olmesartan (BENICAR) 40 MG tablet TAKE 1 TABLET (40 MG TOTAL) BY MOUTH DAILY FOR BLOOD PRESSURE 90 tablet 2   rosuvastatin (CRESTOR) 5 MG tablet TAKE 1 TABLET (5 MG TOTAL) BY MOUTH DAILY FOR CHOLESTEROL 90 tablet 2   Semaglutide, 2 MG/DOSE, (OZEMPIC, 2 MG/DOSE,) 8 MG/3ML SOPN Inject 2 mg into the skin once a week. for diabetes. 9 mL 1   Vitamin D, Ergocalciferol, (DRISDOL) 1.25 MG (50000 UNIT) CAPS capsule TAKE 1 CAPSULE BY MOUTH ONCE WEEKLY FOR 12 WEEKS. 12 capsule 0   No current facility-administered medications on file prior to visit.    BP 130/80   Pulse 92   Temp 97.6 F (36.4 C) (Temporal)   Ht 5\' 3"  (1.6 m)   Wt 244 lb 9.6 oz (110.9 kg)   LMP 05/20/2010   SpO2 94%   BMI 43.33 kg/m  Objective:   Physical Exam Cardiovascular:     Rate and Rhythm: Normal rate and regular rhythm.  Pulmonary:     Effort: Pulmonary effort is normal.     Breath sounds: Normal breath sounds.  Musculoskeletal:     Cervical back: Neck supple.  Skin:    General: Skin is warm and dry.  Neurological:     Mental Status: She is alert and oriented to person, place, and  time.  Psychiatric:        Mood and Affect: Mood normal.           Assessment & Plan:  Type 2 diabetes mellitus with hyperglycemia, without long-term current use of insulin (HCC) Assessment & Plan: Improved and controlled with A1C today of 5.9.  Continue metformin XR 500 mg daily, Ozempic 2 mg weekly.   Urine microalbumin due and pending.  Follow up in 6 months.  Orders: -     POCT glycosylated hemoglobin (Hb A1C) -     Microalbumin / creatinine urine ratio  Need for influenza vaccination -     Flu vaccine trivalent PF, 6mos and older(Flulaval,Afluria,Fluarix,Fluzone)        Doreene Nest, NP

## 2023-01-11 ENCOUNTER — Other Ambulatory Visit: Payer: Self-pay | Admitting: Primary Care

## 2023-01-11 DIAGNOSIS — E1165 Type 2 diabetes mellitus with hyperglycemia: Secondary | ICD-10-CM

## 2023-01-26 ENCOUNTER — Other Ambulatory Visit: Payer: Self-pay | Admitting: Primary Care

## 2023-01-26 DIAGNOSIS — E559 Vitamin D deficiency, unspecified: Secondary | ICD-10-CM

## 2023-02-16 ENCOUNTER — Other Ambulatory Visit: Payer: Self-pay | Admitting: Primary Care

## 2023-02-16 DIAGNOSIS — E1165 Type 2 diabetes mellitus with hyperglycemia: Secondary | ICD-10-CM

## 2023-03-06 ENCOUNTER — Other Ambulatory Visit: Payer: Self-pay | Admitting: Primary Care

## 2023-03-06 DIAGNOSIS — Z1231 Encounter for screening mammogram for malignant neoplasm of breast: Secondary | ICD-10-CM

## 2023-03-21 ENCOUNTER — Ambulatory Visit
Admission: RE | Admit: 2023-03-21 | Discharge: 2023-03-21 | Disposition: A | Discharge status: Home / Self Care | Payer: 59 | Source: Ambulatory Visit | Attending: Primary Care | Admitting: Primary Care

## 2023-03-21 DIAGNOSIS — Z1231 Encounter for screening mammogram for malignant neoplasm of breast: Secondary | ICD-10-CM | POA: Diagnosis present

## 2023-04-15 ENCOUNTER — Other Ambulatory Visit: Payer: Self-pay | Admitting: Primary Care

## 2023-04-15 DIAGNOSIS — I10 Essential (primary) hypertension: Secondary | ICD-10-CM

## 2023-04-22 ENCOUNTER — Other Ambulatory Visit: Payer: Self-pay | Admitting: Primary Care

## 2023-04-22 DIAGNOSIS — E559 Vitamin D deficiency, unspecified: Secondary | ICD-10-CM

## 2023-04-22 NOTE — Telephone Encounter (Signed)
 Please call patient:  We need to update her vitamin D lab to see how she's been doing on the prescription vitamin D dose.   Can we set up a lab appointment?

## 2023-04-23 NOTE — Telephone Encounter (Signed)
 Unable to reach patient. Left voicemail to return call to our office.

## 2023-04-25 NOTE — Telephone Encounter (Signed)
 Called and scheduled patient lab appt for 04/28/23. Orders are placed.

## 2023-04-28 ENCOUNTER — Other Ambulatory Visit (INDEPENDENT_AMBULATORY_CARE_PROVIDER_SITE_OTHER)

## 2023-04-28 DIAGNOSIS — E559 Vitamin D deficiency, unspecified: Secondary | ICD-10-CM

## 2023-04-28 LAB — VITAMIN D 25 HYDROXY (VIT D DEFICIENCY, FRACTURES): VITD: 28.01 ng/mL — ABNORMAL LOW (ref 30.00–100.00)

## 2023-04-29 ENCOUNTER — Other Ambulatory Visit: Payer: Self-pay | Admitting: Primary Care

## 2023-04-29 DIAGNOSIS — G4733 Obstructive sleep apnea (adult) (pediatric): Secondary | ICD-10-CM

## 2023-04-29 DIAGNOSIS — E1165 Type 2 diabetes mellitus with hyperglycemia: Secondary | ICD-10-CM

## 2023-04-29 DIAGNOSIS — E559 Vitamin D deficiency, unspecified: Secondary | ICD-10-CM

## 2023-04-29 DIAGNOSIS — M1A4710 Other secondary chronic gout, right ankle and foot, without tophus (tophi): Secondary | ICD-10-CM

## 2023-04-29 DIAGNOSIS — N289 Disorder of kidney and ureter, unspecified: Secondary | ICD-10-CM

## 2023-04-29 DIAGNOSIS — I10 Essential (primary) hypertension: Secondary | ICD-10-CM

## 2023-04-29 DIAGNOSIS — E7800 Pure hypercholesterolemia, unspecified: Secondary | ICD-10-CM

## 2023-04-29 MED ORDER — VITAMIN D (ERGOCALCIFEROL) 1.25 MG (50000 UNIT) PO CAPS: ORAL_CAPSULE | ORAL | 0 refills | Status: AC

## 2023-05-21 ENCOUNTER — Encounter (INDEPENDENT_AMBULATORY_CARE_PROVIDER_SITE_OTHER): Payer: Self-pay

## 2023-06-05 ENCOUNTER — Other Ambulatory Visit: Payer: Self-pay | Admitting: Primary Care

## 2023-06-05 DIAGNOSIS — E1165 Type 2 diabetes mellitus with hyperglycemia: Secondary | ICD-10-CM

## 2023-06-20 ENCOUNTER — Ambulatory Visit (INDEPENDENT_AMBULATORY_CARE_PROVIDER_SITE_OTHER): Admitting: Primary Care

## 2023-06-20 ENCOUNTER — Encounter: Payer: Self-pay | Admitting: Primary Care

## 2023-06-20 VITALS — BP 132/80 | HR 87 | Temp 97.5°F | Ht 63.0 in | Wt 244.0 lb

## 2023-06-20 DIAGNOSIS — J019 Acute sinusitis, unspecified: Secondary | ICD-10-CM | POA: Insufficient documentation

## 2023-06-20 DIAGNOSIS — R051 Acute cough: Secondary | ICD-10-CM | POA: Diagnosis not present

## 2023-06-20 DIAGNOSIS — J0110 Acute frontal sinusitis, unspecified: Secondary | ICD-10-CM

## 2023-06-20 LAB — POC COVID19 BINAXNOW: SARS Coronavirus 2 Ag: NEGATIVE

## 2023-06-20 MED ORDER — AZITHROMYCIN 250 MG PO TABS: ORAL_TABLET | ORAL | 0 refills | Status: DC

## 2023-06-20 NOTE — Assessment & Plan Note (Addendum)
 Given duration of symptoms, coupled with presentation, will treat for presumed bacterial involvement. Rapid COVID test negative today. She has a penicillin allergy.  Start Azithromycin  antibiotics for infection. Take 2 tablets by mouth today, then 1 tablet daily for 4 additional days.  Continue Flonase, Claritin. Offered work note for which she declines.  Follow-up as needed.

## 2023-06-20 NOTE — Patient Instructions (Signed)
 Start Azithromycin  antibiotics for infection. Take 2 tablets by mouth today, then 1 tablet daily for 4 additional days.  Do not take colchicine  while taking azithromycin  antibiotics.  Continue the Flonase and Claritin.  It was a pleasure to see you today!

## 2023-06-20 NOTE — Progress Notes (Signed)
 Subjective:    Patient ID: Olivia Moreno, female    DOB: 19-Sep-1965, 58 y.o.   MRN: [id number]  HPI  Olivia Moreno is a very pleasant 58 y.o. female with a history of hypertension, type 2 diabetes, sleep apnea, decreased renal function who presents today to discuss nasal congestion.   Symptom onset 10 days ago with nasal congestion, head congestion, cough, and sneezing. Since then she's remained congested in her frontal lobes and behind her nose, she's coughing up yellow mucous, she's expelling yellow mucous from her nasal cavity. She feels fatigued.   She denies fevers, body aches, nausea, chest tightness, wheezing. She's taken Claritin, Mucinex  Sinus and Flonase without improvement.  She has a history of sinusitis, this feels similar.    Review of Systems  Constitutional:  Positive for fatigue. Negative for chills and fever.  HENT:  Positive for congestion, sinus pressure and sneezing. Negative for postnasal drip.   Respiratory:  Positive for cough. Negative for chest tightness, shortness of breath and wheezing.   Neurological:  Positive for headaches.         Past Medical History:  Diagnosis Date   COVID-19 virus infection 01/23/2021   Gout    Hyperlipidemia    Hypertension     Social History   Socioeconomic History   Marital status: Married    Spouse name: Not on file   Number of children: 1   Years of education: Not on file   Highest education level: Some college, no degree  Occupational History   Occupation: unemployed    Associate Professor: UNEMPLOYED  Tobacco Use   Smoking status: Never   Smokeless tobacco: Never  Substance and Sexual Activity   Alcohol use: No   Drug use: No   Sexual activity: Not on file  Other Topics Concern   Not on file  Social History Narrative   Married.   1 child, 1 grandson.   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    Social Drivers of Corporate investment banker Strain: Low Risk  (06/17/2023)    Overall Financial Resource Strain (CARDIA)    Difficulty of Paying Living Expenses: Not hard at all  Food Insecurity: No Food Insecurity (06/17/2023)   Hunger Vital Sign    Worried About Running Out of Food in the Last Year: Never true    Ran Out of Food in the Last Year: Never true  Transportation Needs: No Transportation Needs (06/17/2023)   PRAPARE - Administrator, Civil Service (Medical): No    Lack of Transportation (Non-Medical): No  Physical Activity: Insufficiently Active (06/17/2023)   Exercise Vital Sign    Days of Exercise per Week: 3 days    Minutes of Exercise per Session: 30 min  Stress: No Stress Concern Present (06/17/2023)   [employer] of Occupational Health - Occupational Stress Questionnaire    Feeling of Stress : Only a little  Social Connections: Moderately Isolated (06/17/2023)   Social Connection and Isolation Panel [NHANES]    Frequency of Communication with Friends and Family: Never    Frequency of Social Gatherings with Friends and Family: Once a week    Attends Religious Services: 1 to 4 times per year    Active Member of [employer] or Organizations: No    Attends Banker Meetings: Not on file    Marital Status: Married  Catering manager Violence: Not on file    Past Surgical  History:  Procedure Laterality Date   BREAST BIOPSY Right 02/06/2022   Right Breast Stereo Bx, X Clip -  FIBROADENOMATOID CHANGE WITH ASSOCIATED   BREAST BIOPSY Right 02/06/2022   MM RT BREAST BX W LOC DEV 1ST LESION IMAGE BX SPEC STEREO GUIDE 02/06/2022 [facility]    Family History  Problem Relation Age of Onset   Heart attack Mother    Pulmonary embolism Father    Pulmonary embolism Brother    Breast cancer Neg Hx     Allergies  Allergen Reactions   Penicillins Rash    Reaction as a child    Current Outpatient Medications on File Prior to Visit  Medication Sig Dispense Refill   allopurinol  (ZYLOPRIM ) 100 MG tablet TAKE 1/2 TABLET(50  MG TOTAL) BY MOUTH DAILY. FOR GOUT PREVENTION 45 tablet 2   cetirizine (ZYRTEC) 10 MG tablet Take by mouth. As needed     chlorthalidone  (HYGROTON ) 25 MG tablet Take 1 tablet (25 mg total) by mouth daily. for blood pressure. 90 tablet 3   colchicine  0.6 MG tablet TAKE 2 TABLETS BY MOUTH AT GOUT ONSET, REPEAT WITH 1 TABLET 2 HOURS LATER. THEN TAKE 1 TABLET TWICE DAILY THEREAFTER UNTIL GOUT FLARE RESOLVES. 30 tablet 0   Continuous Glucose Sensor (FREESTYLE LIBRE 3 SENSOR) MISC PLACE 1 SENSOR ON THE SKIN EVERY 14 DAYS. USE TO CHECK GLUCOSE CONTINUOUSLY 6 each 1   metFORMIN  (GLUCOPHAGE -XR) 500 MG 24 hr tablet TAKE 1 TABLET (500 MG TOTAL) BY MOUTH DAILY WITH BREAKFAST. FOR DIABETES. 90 tablet 1   olmesartan  (BENICAR ) 40 MG tablet TAKE 1 TABLET (40 MG TOTAL) BY MOUTH DAILY FOR BLOOD PRESSURE 90 tablet 0   rosuvastatin  (CRESTOR ) 5 MG tablet TAKE 1 TABLET (5 MG TOTAL) BY MOUTH DAILY FOR CHOLESTEROL 90 tablet 2   Semaglutide , 2 MG/DOSE, (OZEMPIC , 2 MG/DOSE,) 8 MG/3ML SOPN INJECT 2 MG INTO THE SKIN ONCE A WEEK. FOR DIABETES. 9 mL 1   Vitamin D , Ergocalciferol , (DRISDOL ) 1.25 MG (50000 UNIT) CAPS capsule Take 1 capsule by mouth once weekly for 12 weeks. 12 capsule 0   No current facility-administered medications on file prior to visit.    BP 132/80   Pulse 87   Temp (!) 97.5 F (36.4 C) (Temporal)   Ht 5\' 3"  (1.6 m)   Wt 244 lb (110.7 kg)   LMP 05/20/2010   SpO2 95%   BMI 43.22 kg/m  Objective:   Physical Exam Constitutional:      Appearance: She is ill-appearing.  HENT:     Right Ear: Tympanic membrane and ear canal normal.     Left Ear: Tympanic membrane and ear canal normal.     Nose: No mucosal edema.     Right Sinus: No maxillary sinus tenderness or frontal sinus tenderness.     Left Sinus: No maxillary sinus tenderness or frontal sinus tenderness.     Mouth/Throat:     Mouth: Mucous membranes are moist.  Eyes:     Conjunctiva/sclera: Conjunctivae normal.  Cardiovascular:     Rate  and Rhythm: Normal rate and regular rhythm.  Pulmonary:     Effort: Pulmonary effort is normal.     Breath sounds: Normal breath sounds. No wheezing or rhonchi.  Musculoskeletal:     Cervical back: Neck supple.  Skin:    General: Skin is warm and dry.           Assessment & Plan:  Acute non-recurrent frontal sinusitis Assessment & Plan: Given duration of symptoms, coupled  with presentation, will treat for presumed bacterial involvement. Rapid COVID test negative today. She has a penicillin allergy.  Start Azithromycin  antibiotics for infection. Take 2 tablets by mouth today, then 1 tablet daily for 4 additional days.  Continue Flonase, Claritin. Offered work note for which she declines.  Follow-up as needed.  Orders: -     Azithromycin ; Take 2 tablets by mouth today, then 1 tablet daily for 4 additional days.  Dispense: 6 tablet; Refill: 0  Acute cough -     POC COVID-19 BinaxNow        Gabriel John, NP

## 2023-06-24 ENCOUNTER — Other Ambulatory Visit: Payer: Self-pay | Admitting: Primary Care

## 2023-06-24 DIAGNOSIS — M1A09X0 Idiopathic chronic gout, multiple sites, without tophus (tophi): Secondary | ICD-10-CM

## 2023-06-25 ENCOUNTER — Ambulatory Visit: Admitting: Primary Care

## 2023-07-02 ENCOUNTER — Other Ambulatory Visit: Payer: Self-pay | Admitting: Primary Care

## 2023-07-02 DIAGNOSIS — E1165 Type 2 diabetes mellitus with hyperglycemia: Secondary | ICD-10-CM

## 2023-07-09 ENCOUNTER — Ambulatory Visit (INDEPENDENT_AMBULATORY_CARE_PROVIDER_SITE_OTHER): Payer: Self-pay | Admitting: Family Medicine

## 2023-07-09 ENCOUNTER — Encounter (INDEPENDENT_AMBULATORY_CARE_PROVIDER_SITE_OTHER): Payer: Self-pay | Admitting: Family Medicine

## 2023-07-09 VITALS — BP 128/83 | HR 91 | Temp 98.5°F | Ht 62.0 in | Wt 239.0 lb

## 2023-07-09 DIAGNOSIS — M25561 Pain in right knee: Secondary | ICD-10-CM

## 2023-07-09 DIAGNOSIS — E119 Type 2 diabetes mellitus without complications: Secondary | ICD-10-CM

## 2023-07-09 DIAGNOSIS — E669 Obesity, unspecified: Secondary | ICD-10-CM

## 2023-07-09 DIAGNOSIS — Z0289 Encounter for other administrative examinations: Secondary | ICD-10-CM

## 2023-07-09 DIAGNOSIS — M109 Gout, unspecified: Secondary | ICD-10-CM

## 2023-07-09 DIAGNOSIS — Z7985 Long-term (current) use of injectable non-insulin antidiabetic drugs: Secondary | ICD-10-CM

## 2023-07-09 DIAGNOSIS — Z7984 Long term (current) use of oral hypoglycemic drugs: Secondary | ICD-10-CM

## 2023-07-09 DIAGNOSIS — E7849 Other hyperlipidemia: Secondary | ICD-10-CM

## 2023-07-09 DIAGNOSIS — I10 Essential (primary) hypertension: Secondary | ICD-10-CM | POA: Diagnosis not present

## 2023-07-09 DIAGNOSIS — Z6841 Body Mass Index (BMI) 40.0 and over, adult: Secondary | ICD-10-CM

## 2023-07-09 DIAGNOSIS — E1169 Type 2 diabetes mellitus with other specified complication: Secondary | ICD-10-CM

## 2023-07-09 NOTE — Progress Notes (Signed)
 Office: [phone]  /  Fax: [phone]  WEIGHT SUMMARY AND BIOMETRICS  Anthropometric Measurements Height: 5\' 2"  (1.575 m) Weight: 239 lb (108.4 kg) BMI (Calculated): 43.7 Peak Weight: 250 lb   Body Composition  Body Fat %: 54.4 % Fat Mass (lbs): 130.2 lbs Muscle Mass (lbs): 103.4 lbs Visceral Fat Rating : 18   Other Clinical Data Fasting: no Labs: no Today's Visit #: Info Session Comments: Info Session    Chief Complaint: OBESITY    History of Present Illness Olivia Moreno is a 58 year old female with obesity, type 2 diabetes, hypertension, and hyperlipidemia who presents for evaluation of obesity and related comorbidities.  She currently weighs 239 pounds with a BMI of 43.7, a visceral fat rating of 18, and a body fat percentage of 54.4. Her weight has been decreasing, but she faces challenges with dietary preferences and exercise due to knee pain and frequent gout flare-ups. She identifies sweets as a weakness and finds breakfast challenging due to a dislike for eggs. She struggles with meal planning, especially since her husband is a picky eater and she often eats out.  She is currently on Ozempic  and metformin  for her diabetes, which has helped improve her A1c levels. However, she notes that Ozempic  has not contributed to weight loss. She is on Crestor  for hyperlipidemia.  She experiences knee pain and more frequent gout flare-ups, for which she is on medication. She avoids known triggers like beef and pork but is unsure if there are other triggers. She expresses difficulty in maintaining a varied diet due to her dietary restrictions and her husband's preferences.  Her social history includes living with her husband and occasionally having her grandson over. She notes that cooking for two is challenging, and she eats out frequently, which she acknowledges is not conducive to weight loss.      PHYSICAL EXAM:  Blood pressure 128/83, pulse 91, temperature  98.5 F (36.9 C), height 5\' 2"  (1.575 m), weight 239 lb (108.4 kg), last menstrual period 05/20/2010, SpO2 97%. Body mass index is 43.71 kg/m.  DIAGNOSTIC DATA REVIEWED:  BMET    Component Value Date/Time   NA 138 07/23/2022 0953   K 4.6 07/23/2022 0953   CL 102 07/23/2022 0953   CO2 28 07/23/2022 0953   GLUCOSE 106 (H) 07/23/2022 0953   BUN 32 (H) 07/23/2022 0953   CREATININE 1.27 (H) 07/23/2022 0953   CREATININE 0.83 10/04/2011 1439   CALCIUM  9.9 07/23/2022 0953   Lab Results  Component Value Date   HGBA1C 5.9 (A) 12/24/2022   HGBA1C 6.2 11/15/2013   No results found for: "INSULIN " Lab Results  Component Value Date   TSH 3.54 03/04/2016   CBC    Component Value Date/Time   WBC 5.8 04/19/2022 0801   RBC 4.18 04/19/2022 0801   HGB 12.7 04/19/2022 0801   HCT 38.2 04/19/2022 0801   PLT 193.0 04/19/2022 0801   MCV 91.5 04/19/2022 0801   MCHC 33.2 04/19/2022 0801   RDW 14.3 04/19/2022 0801   Iron Studies    Component Value Date/Time   IRON 77 04/19/2022 0801   TIBC 350.0 04/19/2022 0801   FERRITIN 155.2 04/19/2022 0801   IRONPCTSAT 22.0 04/19/2022 0801   Lipid Panel     Component Value Date/Time   CHOL 112 04/19/2022 0801   TRIG 102.0 04/19/2022 0801   HDL 46.10 04/19/2022 0801   CHOLHDL 2 04/19/2022 0801   VLDL 20.4 04/19/2022 0801   LDLCALC 45 04/19/2022 0801  LDLDIRECT 148.0 05/08/2017 0806   Hepatic Function Panel     Component Value Date/Time   PROT 7.1 04/19/2022 0801   ALBUMIN 4.2 04/19/2022 0801   AST 20 04/19/2022 0801   ALT 18 04/19/2022 0801   ALKPHOS 71 04/19/2022 0801   BILITOT 0.6 04/19/2022 0801      Component Value Date/Time   TSH 3.54 03/04/2016 1221   Nutritional Lab Results  Component Value Date   VD25OH 28.01 (L) 04/28/2023   VD25OH 17.67 (L) 04/19/2022   VD25OH 13.16 (L) 05/24/2021     Assessment and Plan Assessment & Plan Obesity Obesity with a BMI of 43.7, visceral fat rating of 18, and body fat percentage of  54.4. Weight reduction is ongoing, but challenges include dietary preferences, knee pain limiting exercise, and frequent dining out. Ozempic  has not been effective for weight loss, though it has improved A1c. Discussed genetic predisposition to weight retention and the body's resistance to weight loss. Emphasized the need for a personalized eating plan and understanding individual triggers for weight gain. A comprehensive workup is necessary to identify mechanisms contributing to weight retention and to develop a sustainable eating plan tailored to her needs. - Schedule initial workup visit with fasting for 8 hours, no exercise, caffeine, or nicotine, and drink water. - Perform EKG and metabolism test. - Conduct comprehensive lab tests to identify factors contributing to weight retention. - Provide a customized eating plan to prevent the body from resisting weight loss. - Schedule follow-up every two weeks initially, then extend as needed. - Discuss potential use of medications for weight loss, with shared decision-making based on health improvement.  Type 2 diabetes mellitus Type 2 diabetes mellitus managed with Ozempic  and metformin . A1c has been improving. Discussed the importance of monitoring blood glucose as part of the overall health improvement plan. - Continue current medications: Ozempic  and metformin . - Include diabetes management in the overall health improvement plan.  Will adjust nutritional plan to address this  Gout Gout with increased flare-ups. Identified beef and pork as known triggers, with sweets as a dietary weakness. Discussed the impact of fatty acids in red meat and pork on gout flare-ups. Emphasized the importance of dietary management to prevent flare-ups while maintaining variety in meals. - Continue current medication for gout management. - Avoid known dietary triggers such as beef and pork. - Explore dietary options to manage gout while maintaining variety in meals.   Will adjust nutritional plan to address this  Hypertension Hypertension is well-controlled. - Monitor blood pressure as part of the overall health improvement plan. - Will adjust nutritional plan to address this  Hyperlipidemia Hyperlipidemia managed with Crestor , with reported improvement in cholesterol levels. - Continue Crestor  for hyperlipidemia management. - Monitor cholesterol levels as part of the overall health improvement plan.  Will adjust nutritional plan to address this     I have personally spent 49 minutes total time today in preparation, patient care, and documentation for this visit, including the following: review of clinical lab tests; review of medical history, review of the pathophysiology of obesity, review of personalized weight goals based on her health and habitus.   She was informed of the importance of frequent follow up visits to maximize her success with intensive lifestyle modifications for her multiple health conditions.    Jasmine Mesi, MD

## 2023-07-13 ENCOUNTER — Other Ambulatory Visit: Payer: Self-pay | Admitting: Primary Care

## 2023-07-13 DIAGNOSIS — M1A09X0 Idiopathic chronic gout, multiple sites, without tophus (tophi): Secondary | ICD-10-CM

## 2023-07-13 DIAGNOSIS — M1A4710 Other secondary chronic gout, right ankle and foot, without tophus (tophi): Secondary | ICD-10-CM

## 2023-07-14 ENCOUNTER — Other Ambulatory Visit: Payer: Self-pay | Admitting: Primary Care

## 2023-07-14 DIAGNOSIS — I10 Essential (primary) hypertension: Secondary | ICD-10-CM

## 2023-07-14 DIAGNOSIS — E782 Mixed hyperlipidemia: Secondary | ICD-10-CM

## 2023-07-25 ENCOUNTER — Encounter: Payer: Self-pay | Admitting: Primary Care

## 2023-07-25 ENCOUNTER — Ambulatory Visit (INDEPENDENT_AMBULATORY_CARE_PROVIDER_SITE_OTHER): Payer: BC Managed Care – PPO | Admitting: Primary Care

## 2023-07-25 VITALS — BP 100/62 | HR 91 | Temp 97.2°F | Ht 62.0 in | Wt 244.0 lb

## 2023-07-25 DIAGNOSIS — M1A49X0 Other secondary chronic gout, multiple sites, without tophus (tophi): Secondary | ICD-10-CM

## 2023-07-25 DIAGNOSIS — Z1211 Encounter for screening for malignant neoplasm of colon: Secondary | ICD-10-CM

## 2023-07-25 DIAGNOSIS — Z0000 Encounter for general adult medical examination without abnormal findings: Secondary | ICD-10-CM | POA: Diagnosis not present

## 2023-07-25 DIAGNOSIS — I10 Essential (primary) hypertension: Secondary | ICD-10-CM | POA: Diagnosis not present

## 2023-07-25 DIAGNOSIS — Z7985 Long-term (current) use of injectable non-insulin antidiabetic drugs: Secondary | ICD-10-CM

## 2023-07-25 DIAGNOSIS — G4733 Obstructive sleep apnea (adult) (pediatric): Secondary | ICD-10-CM

## 2023-07-25 DIAGNOSIS — Z7984 Long term (current) use of oral hypoglycemic drugs: Secondary | ICD-10-CM

## 2023-07-25 DIAGNOSIS — E1165 Type 2 diabetes mellitus with hyperglycemia: Secondary | ICD-10-CM

## 2023-07-25 DIAGNOSIS — E559 Vitamin D deficiency, unspecified: Secondary | ICD-10-CM | POA: Diagnosis not present

## 2023-07-25 DIAGNOSIS — E7800 Pure hypercholesterolemia, unspecified: Secondary | ICD-10-CM | POA: Diagnosis not present

## 2023-07-25 DIAGNOSIS — E1169 Type 2 diabetes mellitus with other specified complication: Secondary | ICD-10-CM

## 2023-07-25 LAB — LIPID PANEL
Cholesterol: 140 mg/dL (ref 0–200)
HDL: 48.6 mg/dL (ref >39.00)
LDL Cholesterol: 70 mg/dL (ref 0–99)
NonHDL: 91.49
Total CHOL/HDL Ratio: 3
Triglycerides: 105 mg/dL (ref 0.0–149.0)
VLDL: 21 mg/dL (ref 0.0–40.0)

## 2023-07-25 LAB — HEMOGLOBIN A1C: Hgb A1c MFr Bld: 5.8 % (ref 4.6–6.5)

## 2023-07-25 LAB — COMPREHENSIVE METABOLIC PANEL WITH GFR
ALT: 23 U/L (ref 0–35)
AST: 24 U/L (ref 0–37)
Albumin: 4.6 g/dL (ref 3.5–5.2)
Alkaline Phosphatase: 88 U/L (ref 39–117)
BUN: 27 mg/dL — ABNORMAL HIGH (ref 6–23)
CO2: 29 meq/L (ref 19–32)
Calcium: 9.7 mg/dL (ref 8.4–10.5)
Chloride: 103 meq/L (ref 96–112)
Creatinine, Ser: 1.37 mg/dL — ABNORMAL HIGH (ref 0.40–1.20)
GFR: 42.49 mL/min — ABNORMAL LOW (ref >60.00)
Glucose, Bld: 108 mg/dL — ABNORMAL HIGH (ref 70–99)
Potassium: 4.6 meq/L (ref 3.5–5.1)
Sodium: 140 meq/L (ref 135–145)
Total Bilirubin: 0.7 mg/dL (ref 0.2–1.2)
Total Protein: 7.1 g/dL (ref 6.0–8.3)

## 2023-07-25 LAB — URIC ACID: Uric Acid, Serum: 9.6 mg/dL — ABNORMAL HIGH (ref 2.4–7.0)

## 2023-07-25 LAB — MICROALBUMIN / CREATININE URINE RATIO
Creatinine,U: 124.1 mg/dL
Microalb Creat Ratio: 10.8 mg/g (ref 0.0–30.0)
Microalb, Ur: 1.3 mg/dL (ref 0.0–1.9)

## 2023-07-25 NOTE — Assessment & Plan Note (Signed)
 Repeat lipid panel pending. Continue rosuvastatin 5 mg daily.

## 2023-07-25 NOTE — Progress Notes (Signed)
 Subjective:    Patient ID: Olivia Moreno, female    DOB: [birth year], 58 y.o.   MRN: [id number]  HPI  JIMEKA BALAN is a very pleasant 58 y.o. female who presents today for complete physical and follow up of chronic conditions.  She would also like to discuss gout. Currently managed on allopurinol  50 mg daily for gout prevention and colchicine  0.6 mg PRN. Gout flares occur once monthly on average, does have chronic left elbow pain daily. The colchicine  does help during gout attacks.  She is also managed on chlorthalidone  for hypertension.  Immunizations: -Tetanus: Completed in 2023  -Shingles: Completed Shingrix series -Pneumonia: Completed in 2019  Diet: Fair diet.  Exercise: No regular exercise.  Eye exam: Completes annually  Dental exam: Completes semi-annually    Pap Smear: Completed in 2022 Mammogram: Completed in February 2025  Colonoscopy: Never completed, has declined colonoscopy multiple years.    BP Readings from Last 3 Encounters:  07/25/23 100/62  07/09/23 128/83  06/20/23 132/80      Review of Systems  Constitutional:  Negative for unexpected weight change.  HENT:  Negative for rhinorrhea.   Respiratory:  Negative for cough and shortness of breath.   Cardiovascular:  Negative for chest pain.  Gastrointestinal:  Negative for constipation and diarrhea.  Genitourinary:  Negative for difficulty urinating.  Musculoskeletal:  Positive for arthralgias.  Skin:  Negative for rash.  Allergic/Immunologic: Negative for environmental allergies.  Neurological:  Negative for dizziness, numbness and headaches.  Psychiatric/Behavioral:  The patient is not nervous/anxious.          Past Medical History:  Diagnosis Date   COVID-19 virus infection 01/23/2021   Gout    Hyperlipidemia    Hypertension     Social History   Socioeconomic History   Marital status: Married    Spouse name: Not on file   Number of children: 1   Years of education: Not on file    Highest education level: Some college, no degree  Occupational History   Occupation: unemployed    Associate Professor: UNEMPLOYED  Tobacco Use   Smoking status: Never   Smokeless tobacco: Never  Substance and Sexual Activity   Alcohol use: No   Drug use: No   Sexual activity: Not on file  Other Topics Concern   Not on file  Social History Narrative   Married.   1 child, 1 grandson.   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    Social Drivers of Corporate investment banker Strain: Low Risk  (06/17/2023)   Overall Financial Resource Strain (CARDIA)    Difficulty of Paying Living Expenses: Not hard at all  Food Insecurity: No Food Insecurity (06/17/2023)   Hunger Vital Sign    Worried About Running Out of Food in the Last Year: Never true    Ran Out of Food in the Last Year: Never true  Transportation Needs: No Transportation Needs (06/17/2023)   PRAPARE - Administrator, Civil Service (Medical): No    Lack of Transportation (Non-Medical): No  Physical Activity: Insufficiently Active (06/17/2023)   Exercise Vital Sign    Days of Exercise per Week: 3 days    Minutes of Exercise per Session: 30 min  Stress: No Stress Concern Present (06/17/2023)   [employer] of Occupational Health - Occupational Stress Questionnaire    Feeling of Stress : Only a little  Social Connections: Moderately Isolated (06/17/2023)  Social Advertising account executive [NHANES]    Frequency of Communication with Friends and Family: Never    Frequency of Social Gatherings with Friends and Family: Once a week    Attends Religious Services: 1 to 4 times per year    Active Member of [employer] or Organizations: No    Attends Engineer, structural: Not on file    Marital Status: Married  Catering manager Violence: Not on file    Past Surgical History:  Procedure Laterality Date   BREAST BIOPSY Right 02/06/2022   Right Breast Stereo Bx, X Clip -   FIBROADENOMATOID CHANGE WITH ASSOCIATED   BREAST BIOPSY Right 02/06/2022   MM RT BREAST BX W LOC DEV 1ST LESION IMAGE BX SPEC STEREO GUIDE 02/06/2022 [facility]    Family History  Problem Relation Age of Onset   Heart attack Mother    Pulmonary embolism Father    Pulmonary embolism Brother    Breast cancer Neg Hx     Allergies  Allergen Reactions   Penicillins Rash    Reaction as a child    Current Outpatient Medications on File Prior to Visit  Medication Sig Dispense Refill   allopurinol  (ZYLOPRIM ) 100 MG tablet TAKE 1/2 TABLET(50 MG TOTAL) BY MOUTH DAILY. FOR GOUT PREVENTION 45 tablet 0   cetirizine (ZYRTEC) 10 MG tablet Take by mouth. As needed     chlorthalidone  (HYGROTON ) 25 MG tablet TAKE 1 TABLET BY MOUTH EVERY DAY FOR BLOOD PRESSURE 90 tablet 0   colchicine  0.6 MG tablet TAKE 2 TABLETS BY MOUTH AT GOUT ONSET, REPEAT WITH 1 TABLET 2 HOURS LATER. THEN TAKE 1 TABLET TWICE DAILY THEREAFTER UNTIL GOUT FLARE RESOLVES. 30 tablet 0   Continuous Glucose Sensor (FREESTYLE LIBRE 3 SENSOR) MISC PLACE 1 SENSOR ON THE SKIN EVERY 14 DAYS. USE TO CHECK GLUCOSE CONTINUOUSLY 6 each 1   metFORMIN  (GLUCOPHAGE -XR) 500 MG 24 hr tablet TAKE 1 TABLET (500 MG TOTAL) BY MOUTH DAILY WITH BREAKFAST. FOR DIABETES. 90 tablet 1   olmesartan  (BENICAR ) 40 MG tablet TAKE 1 TABLET (40 MG TOTAL) BY MOUTH DAILY FOR BLOOD PRESSURE 90 tablet 0   rosuvastatin  (CRESTOR ) 5 MG tablet TAKE 1 TABLET (5 MG TOTAL) BY MOUTH DAILY FOR CHOLESTEROL. 90 tablet 0   Semaglutide , 2 MG/DOSE, (OZEMPIC , 2 MG/DOSE,) 8 MG/3ML SOPN INJECT 2 MG INTO THE SKIN ONCE A WEEK. FOR DIABETES. 9 mL 0   Vitamin D , Ergocalciferol , (DRISDOL ) 1.25 MG (50000 UNIT) CAPS capsule Take 1 capsule by mouth once weekly for 12 weeks. 12 capsule 0   No current facility-administered medications on file prior to visit.    BP 100/62   Pulse 91   Temp (!) 97.2 F (36.2 C) (Temporal)   Ht 5\' 2"  (1.575 m)   Wt 244 lb (110.7 kg)   LMP 05/20/2010    SpO2 96%   BMI 44.63 kg/m  Objective:   Physical Exam HENT:     Right Ear: Tympanic membrane and ear canal normal.     Left Ear: Tympanic membrane and ear canal normal.  Eyes:     Pupils: Pupils are equal, round, and reactive to light.  Cardiovascular:     Rate and Rhythm: Normal rate and regular rhythm.  Pulmonary:     Effort: Pulmonary effort is normal.     Breath sounds: Normal breath sounds.  Abdominal:     General: Bowel sounds are normal.     Palpations: Abdomen is soft.     Tenderness: There is  no abdominal tenderness.  Musculoskeletal:        General: Normal range of motion.     Cervical back: Neck supple.  Skin:    General: Skin is warm and dry.  Neurological:     Mental Status: She is alert and oriented to person, place, and time.     Cranial Nerves: No cranial nerve deficit.     Deep Tendon Reflexes:     Reflex Scores:      Patellar reflexes are 2+ on the right side and 2+ on the left side. Psychiatric:        Mood and Affect: Mood normal.           Assessment & Plan:  Preventative health care Assessment & Plan: Immunizations UTD. Pap smear due, she declines today but will reschedule. Mammogram UTD Colonoscopy overdue, she continues to decline.  She does agree to [employer].  Orders placed.  Discussed the importance of a healthy diet and regular exercise in order for weight loss, and to reduce the risk of further co-morbidity.  Exam stable. Labs pending.  Follow up in 1 year for repeat physical.    Vitamin D  deficiency Assessment & Plan: Repeat vitamin D  level pending. Continue vitamin D  50,000 international units  weekly  Orders: -     VITAMIN D  25 Hydroxy (Vit-D Deficiency, Fractures)  Pure hypercholesterolemia Assessment & Plan: Repeat lipid panel pending. Continue rosuvastatin  5 mg daily.  Orders: -     Lipid panel  Other secondary chronic gout of multiple sites without tophus Assessment & Plan: Question if her symptoms are from  gout or side effects from statin.  Uric acid level pending today.  If above goal then suspect gout to be the cause.  If uric acid level within normal range then we will hold statin for 2 weeks.  Also consider switching from allopurinol  to Uloric given renal function. We also discussed holding chlorthalidone  x 2 weeks given great blood pressure control and the fact that thiazide diuretics can provoke gout.  Await lab results.  Orders: -     Uric acid  Sleep apnea, obstructive Assessment & Plan: Continue CPAP nightly.   Primary hypertension Assessment & Plan: Well-controlled.  Continue olmesartan  40 mg daily.  Continue chlorthalidone  25 mg daily for now.  Consider holding for recurrent gout symptoms.  See notes under gout.  Orders: -     Comprehensive metabolic panel with GFR  Screening for colon cancer -     Cologuard  Type 2 diabetes mellitus with other specified complication, without long-term current use of insulin  (HCC) -     Hemoglobin A1c -     Microalbumin / creatinine urine ratio  Type 2 diabetes mellitus with hyperglycemia, without long-term current use of insulin  (HCC) Assessment & Plan: Repeat A1c pending.  Continue Ozempic  2 mg weekly, metformin  ER 500 mg daily. Urine microalbuminpending. Foot exam done today.         Mireya Meditz K Kamree Wiens, NP

## 2023-07-25 NOTE — Assessment & Plan Note (Signed)
Continue CPAP nightly. °

## 2023-07-25 NOTE — Assessment & Plan Note (Signed)
 Well-controlled.  Continue olmesartan  40 mg daily.  Continue chlorthalidone  25 mg daily for now.  Consider holding for recurrent gout symptoms.  See notes under gout.

## 2023-07-25 NOTE — Assessment & Plan Note (Signed)
 Question if her symptoms are from gout or side effects from statin.  Uric acid level pending today.  If above goal then suspect gout to be the cause.  If uric acid level within normal range then we will hold statin for 2 weeks.  Also consider switching from allopurinol  to Uloric given renal function. We also discussed holding chlorthalidone  x 2 weeks given great blood pressure control and the fact that thiazide diuretics can provoke gout.  Await lab results.

## 2023-07-25 NOTE — Patient Instructions (Signed)
 Stop by the lab prior to leaving today. I will notify you of your results once received.   Complete the Cologuard kit once received.  It was a pleasure to see you today!

## 2023-07-25 NOTE — Assessment & Plan Note (Signed)
 Repeat A1c pending.  Continue Ozempic  2 mg weekly, metformin  ER 500 mg daily. Urine microalbuminpending. Foot exam done today.

## 2023-07-25 NOTE — Assessment & Plan Note (Signed)
 Repeat vitamin D  level pending. Continue vitamin D  50,000 international units  weekly

## 2023-07-25 NOTE — Assessment & Plan Note (Signed)
 Immunizations UTD. Pap smear due, she declines today but will reschedule. Mammogram UTD Colonoscopy overdue, she continues to decline.  She does agree to [employer].  Orders placed.  Discussed the importance of a healthy diet and regular exercise in order for weight loss, and to reduce the risk of further co-morbidity.  Exam stable. Labs pending.  Follow up in 1 year for repeat physical.

## 2023-07-28 ENCOUNTER — Other Ambulatory Visit: Payer: Self-pay | Admitting: Primary Care

## 2023-07-28 ENCOUNTER — Ambulatory Visit: Payer: Self-pay | Admitting: Primary Care

## 2023-07-28 DIAGNOSIS — M1A4710 Other secondary chronic gout, right ankle and foot, without tophus (tophi): Secondary | ICD-10-CM

## 2023-07-28 LAB — VITAMIN D 25 HYDROXY (VIT D DEFICIENCY, FRACTURES): VITD: 34.92 ng/mL (ref 30.00–100.00)

## 2023-08-19 ENCOUNTER — Encounter (INDEPENDENT_AMBULATORY_CARE_PROVIDER_SITE_OTHER): Payer: Self-pay

## 2023-08-20 ENCOUNTER — Other Ambulatory Visit: Payer: Self-pay | Admitting: Primary Care

## 2023-08-20 DIAGNOSIS — E1165 Type 2 diabetes mellitus with hyperglycemia: Secondary | ICD-10-CM

## 2023-08-21 ENCOUNTER — Ambulatory Visit: Payer: Self-pay | Admitting: Primary Care

## 2023-08-21 ENCOUNTER — Ambulatory Visit: Admitting: Primary Care

## 2023-08-21 VITALS — BP 128/82 | HR 75 | Temp 98.4°F | Ht 62.0 in | Wt 245.0 lb

## 2023-08-21 DIAGNOSIS — M1A4710 Other secondary chronic gout, right ankle and foot, without tophus (tophi): Secondary | ICD-10-CM | POA: Diagnosis not present

## 2023-08-21 DIAGNOSIS — M545 Low back pain, unspecified: Secondary | ICD-10-CM | POA: Diagnosis not present

## 2023-08-21 DIAGNOSIS — I10 Essential (primary) hypertension: Secondary | ICD-10-CM

## 2023-08-21 LAB — BASIC METABOLIC PANEL WITH GFR
BUN: 21 mg/dL (ref 6–23)
CO2: 28 meq/L (ref 19–32)
Calcium: 9.5 mg/dL (ref 8.4–10.5)
Chloride: 107 meq/L (ref 96–112)
Creatinine, Ser: 1.25 mg/dL — ABNORMAL HIGH (ref 0.40–1.20)
GFR: 47.4 mL/min — ABNORMAL LOW
Glucose, Bld: 102 mg/dL — ABNORMAL HIGH (ref 70–99)
Potassium: 4.7 meq/L (ref 3.5–5.1)
Sodium: 141 meq/L (ref 135–145)

## 2023-08-21 LAB — URIC ACID: Uric Acid, Serum: 7.5 mg/dL — ABNORMAL HIGH (ref 2.4–7.0)

## 2023-08-21 MED ORDER — CYCLOBENZAPRINE HCL 5 MG PO TABS: 5.0000 mg | ORAL_TABLET | Freq: Three times a day (TID) | ORAL | 0 refills | Status: DC | PRN
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
## 2023-08-21 NOTE — Patient Instructions (Addendum)
 Stop by the lab prior to leaving today. I will notify you of your results once received.   Keep monitoring your blood pressure levels.   You may take the cyclobenzaprine muscle relaxer pill every 8 hours as needed for muscle spasms.  This may cause drowsiness.  It was a pleasure to see you today!

## 2023-08-21 NOTE — Assessment & Plan Note (Signed)
 All symptoms have resolved.  Remain off chlorthalidone . Repeat uric acid and BMP pending

## 2023-08-21 NOTE — Assessment & Plan Note (Signed)
 Controlled.  Remain off chlorthalidone . Consider valsartan if needed in the future. She will monitor blood pressure readings.

## 2023-09-09 ENCOUNTER — Encounter (INDEPENDENT_AMBULATORY_CARE_PROVIDER_SITE_OTHER): Payer: Self-pay | Admitting: Family Medicine

## 2023-09-09 ENCOUNTER — Ambulatory Visit (INDEPENDENT_AMBULATORY_CARE_PROVIDER_SITE_OTHER): Admitting: Family Medicine

## 2023-09-09 VITALS — BP 118/70 | HR 81 | Temp 98.3°F | Ht 62.0 in | Wt 239.0 lb

## 2023-09-09 DIAGNOSIS — E669 Obesity, unspecified: Secondary | ICD-10-CM

## 2023-09-09 DIAGNOSIS — E559 Vitamin D deficiency, unspecified: Secondary | ICD-10-CM

## 2023-09-09 DIAGNOSIS — E119 Type 2 diabetes mellitus without complications: Secondary | ICD-10-CM

## 2023-09-09 DIAGNOSIS — R0602 Shortness of breath: Secondary | ICD-10-CM

## 2023-09-09 DIAGNOSIS — Z7985 Long-term (current) use of injectable non-insulin antidiabetic drugs: Secondary | ICD-10-CM

## 2023-09-09 DIAGNOSIS — Z6841 Body Mass Index (BMI) 40.0 and over, adult: Secondary | ICD-10-CM | POA: Diagnosis not present

## 2023-09-09 DIAGNOSIS — Z1331 Encounter for screening for depression: Secondary | ICD-10-CM | POA: Diagnosis not present

## 2023-09-09 DIAGNOSIS — R5383 Other fatigue: Secondary | ICD-10-CM | POA: Diagnosis not present

## 2023-09-09 DIAGNOSIS — E1122 Type 2 diabetes mellitus with diabetic chronic kidney disease: Secondary | ICD-10-CM | POA: Insufficient documentation

## 2023-09-09 DIAGNOSIS — Z7984 Long term (current) use of oral hypoglycemic drugs: Secondary | ICD-10-CM

## 2023-09-09 NOTE — Progress Notes (Signed)
 Office: [phone]  /  Fax: [phone]  WEIGHT SUMMARY AND BIOMETRICS  Anthropometric Measurements Height: 5' 2 (1.575 m) Weight: 239 lb (108.4 kg) BMI (Calculated): 43.7 Starting Weight: 239 lb Peak Weight: 253 lb Waist Measurement : 53 inches   Body Composition  Body Fat %: 52.1 % Fat Mass (lbs): 124.6 lbs Muscle Mass (lbs): 108.6 lbs Total Body Water (lbs): 84 lbs Visceral Fat Rating : 18   Other Clinical Data A1c: 5.8 mg/dL RMR: 8357 Fasting: yes Labs: yes Today's Visit #: 1st Starting Date: 09/09/23 Comments: First Visit    Chief Complaint: OBESITY   Discussed the use of AI scribe software for clinical note transcription with the patient, who gave verbal consent to proceed.  History of Present Illness Olivia Moreno is a 58 year old female with obesity, type two diabetes, and hyperlipidemia who presents for a workup to determine her most ideal treatment plan.  She experiences fatigue that has worsened with weight gain, impacting her daily activities and overall quality of life. No fever, night sweats, or unexplained weight loss are reported.  She has dyspnea on exertion, which has worsened with her weight gain, limiting her ability to perform physical activities. No chest pain, palpitations, or syncope are noted.  Her type two diabetes is treated with metformin  and Ozempic .  She has a history of hyperlipidemia.  She is noted to have a vitamin D  deficiency.  She has filled out her new patient paperwork, including a large questionnaire on eating behaviors, preferences, habits, and social situations. Pertinent positives are as follows:  She notes fatigue that seems to be worsening with increased weight gain. She reports dyspnea on exertion that appears to be worsening with weight gain. She is on metformin  and Ozempic  for her diabetes. She is on Crestor  5 mg daily for her hyperlipidemia. She is on olmesartan  40 mg daily for her hypertension.  She has  not had weight loss surgery previously but has considered it, as she felt it would help her with weight loss; however, she is not currently seeking surgery at this time.  Social history: She works as a Therapist, art 8 hours a week. She lives with her husband, Elsie, and feels he will be supportive of her weight loss efforts. She believes he will eat healthier with her. She does not have any smoking history. She is not currently exercising.  Weight history: She is 5 feet 3 inches tall. She would like to be 170 pounds within 1 year, which would be a relatively aggressive rate of weight loss. She wants to lose weight, especially because of her knee pain. She wants to be able to walk more and feel better. She has been overweight most of her life, noting excessive weight gain after high school. Her heaviest weight is 253 pounds. She has previously done Weight Watchers to lose weight but struggled to maintain that weight loss.  Nutritional evaluation: She eats outside the home 4 to 5 times per week, opting for fast food or takeout 4 to 5 times per week. Her husband does the grocery shopping most of the time, but not on a regular basis. She does not enjoy cooking and often feels too tired to do so. She craves sugary sweets and feels her cravings are stronger in the late afternoon. She does not like eggs, peppers, onions, olives, or scallops but does not consider herself a picky eater. However, she does snack frequently at night, particularly on sugar. She does not wake up in  the middle of the night hungry. She skips breakfast 2 to 3 times a week. She feels that eating healthier has been financially difficult. She is not following a vegetarian lifestyle. She drinks 1 diet soda per day, mostly water otherwise. She does not typically add sugar to food. She believes her worst food habits are fried foods and sweets. She struggles with poor food choices and sometimes experiences excessive hunger, also noting  issues with portion control. She eats until comfortably full but gets hungry very soon afterward, maybe within 15 to 30 minutes.  Mood and food: She notes that she eats when stressed, sad, as a reward, when bored, and for comfort. She does not feel guilt or self-hate about making poor food choices. She has felt judged about her weight and eating habits when growing up but does not feel out of control with her eating. She generally does not eat until she feels uncomfortably full. She believes she eats a larger portion than the average person about 1 time a week or less. She does not feel that she has any type of eating disorder nor has she been diagnosed with an eating disorder by a physician in the past.  Test results: She had a modified Q-9 mood and food score that is mildly elevated. She had an Epworth sleepiness score of 4, which is negative. An EKG done today shows normal sinus rhythm with a ventricular rate of 70 beats per minute. Her basal metabolic rate is calculated at 1651, and her resting energy expenditure measured by indirect calorimetry is 1642.      PHYSICAL EXAM:  Blood pressure 118/70, pulse 81, temperature 98.3 F (36.8 C), height 5' 2 (1.575 m), weight 239 lb (108.4 kg), last menstrual period 05/20/2010, SpO2 98%. Body mass index is 43.71 kg/m.  DIAGNOSTIC DATA REVIEWED:  BMET    Component Value Date/Time   NA 141 08/21/2023 0956   K 4.7 08/21/2023 0956   CL 107 08/21/2023 0956   CO2 28 08/21/2023 0956   GLUCOSE 102 (H) 08/21/2023 0956   BUN 21 08/21/2023 0956   CREATININE 1.25 (H) 08/21/2023 0956   CREATININE 0.83 10/04/2011 1439   CALCIUM  9.5 08/21/2023 0956   Lab Results  Component Value Date   HGBA1C 5.8 07/25/2023   HGBA1C 6.2 11/15/2013   No results found for: INSULIN  Lab Results  Component Value Date   TSH 3.54 03/04/2016   CBC    Component Value Date/Time   WBC 5.8 04/19/2022 0801   RBC 4.18 04/19/2022 0801   HGB 12.7 04/19/2022 0801   HCT  38.2 04/19/2022 0801   PLT 193.0 04/19/2022 0801   MCV 91.5 04/19/2022 0801   MCHC 33.2 04/19/2022 0801   RDW 14.3 04/19/2022 0801   Iron Studies    Component Value Date/Time   IRON 77 04/19/2022 0801   TIBC 350.0 04/19/2022 0801   FERRITIN 155.2 04/19/2022 0801   IRONPCTSAT 22.0 04/19/2022 0801   Lipid Panel     Component Value Date/Time   CHOL 140 07/25/2023 0833   TRIG 105.0 07/25/2023 0833   HDL 48.60 07/25/2023 0833   CHOLHDL 3 07/25/2023 0833   VLDL 21.0 07/25/2023 0833   LDLCALC 70 07/25/2023 0833   LDLDIRECT 148.0 05/08/2017 0806   Hepatic Function Panel     Component Value Date/Time   PROT 7.1 07/25/2023 0833   ALBUMIN 4.6 07/25/2023 0833   AST 24 07/25/2023 0833   ALT 23 07/25/2023 0833   ALKPHOS 88 07/25/2023 0833  BILITOT 0.7 07/25/2023 0833      Component Value Date/Time   TSH 3.54 03/04/2016 1221   Nutritional Lab Results  Component Value Date   VD25OH 34.92 07/25/2023   VD25OH 28.01 (L) 04/28/2023   VD25OH 17.67 (L) 04/19/2022     Assessment and Plan Assessment & Plan Obesity Obesity is contributing to fatigue and dyspnea on exertion. Focus on lifestyle modifications, including diet and weight loss, to address this issue. - Initiate a category two eating plan. - Hold off on exercise for the next couple of weeks until test results are reviewed. - Avoid weighing yourself frequently  Type 2 Diabetes Mellitus Type 2 diabetes mellitus is currently managed with metformin  and Ozempic . -Start Cat 2 eating plan and follow up I 2 weeks to review  Hyperlipidemia Hyperlipidemia will be addressed as part of overall health management plan. -Start Cat 2 eating plan and follow up I 2 weeks to review  Vitamin D  Deficiency Vitamin D  deficiency will be monitored and managed as part of health plan.  General Health Maintenance Focus on lifestyle modifications, including diet and exercise, to improve overall health and manage existing conditions.      I have personally spent 52 minutes total time today in preparation, patient care, and documentation for this visit, including the following: review of clinical lab tests; review of medical history, review of extensive nutritional intake paperwork and nutritional counseling   She was informed of the importance of frequent follow up visits to maximize her success with intensive lifestyle modifications for her multiple health conditions.    Louann Penton, MD

## 2023-09-10 LAB — CBC WITH DIFFERENTIAL/PLATELET
Basophils Absolute: 0 x10E3/uL (ref 0.0–0.2)
Basos: 1 %
EOS (ABSOLUTE): 0 x10E3/uL (ref 0.0–0.4)
Eos: 1 %
Hematocrit: 42.1 % (ref 34.0–46.6)
Hemoglobin: 13.4 g/dL (ref 11.1–15.9)
Immature Grans (Abs): 0 x10E3/uL (ref 0.0–0.1)
Immature Granulocytes: 0 %
Lymphocytes Absolute: 1.1 x10E3/uL (ref 0.7–3.1)
Lymphs: 19 %
MCH: 30 pg (ref 26.6–33.0)
MCHC: 31.8 g/dL (ref 31.5–35.7)
MCV: 94 fL (ref 79–97)
Monocytes Absolute: 0.3 x10E3/uL (ref 0.1–0.9)
Monocytes: 5 %
Neutrophils Absolute: 4.5 x10E3/uL (ref 1.4–7.0)
Neutrophils: 74 %
Platelets: 178 x10E3/uL (ref 150–450)
RBC: 4.47 x10E6/uL (ref 3.77–5.28)
RDW: 13.2 % (ref 11.7–15.4)
WBC: 6 x10E3/uL (ref 3.4–10.8)

## 2023-09-10 LAB — COMPREHENSIVE METABOLIC PANEL WITH GFR
ALT: 25 IU/L (ref 0–32)
AST: 27 IU/L (ref 0–40)
Albumin: 4.4 g/dL (ref 3.8–4.9)
Alkaline Phosphatase: 106 IU/L (ref 44–121)
BUN/Creatinine Ratio: 18 (ref 9–23)
BUN: 21 mg/dL (ref 6–24)
Bilirubin Total: 0.5 mg/dL (ref 0.0–1.2)
CO2: 20 mmol/L (ref 20–29)
Calcium: 10.1 mg/dL (ref 8.7–10.2)
Chloride: 104 mmol/L (ref 96–106)
Creatinine, Ser: 1.17 mg/dL — ABNORMAL HIGH (ref 0.57–1.00)
Globulin, Total: 2.6 g/dL (ref 1.5–4.5)
Glucose: 96 mg/dL (ref 70–99)
Potassium: 5.2 mmol/L (ref 3.5–5.2)
Sodium: 140 mmol/L (ref 134–144)
Total Protein: 7 g/dL (ref 6.0–8.5)
eGFR: 54 mL/min/1.73 — ABNORMAL LOW (ref >59)

## 2023-09-10 LAB — LIPID PANEL
Chol/HDL Ratio: 2.7 ratio (ref 0.0–4.4)
Cholesterol, Total: 137 mg/dL (ref 100–199)
HDL: 50 mg/dL (ref >39)
LDL Chol Calc (NIH): 67 mg/dL (ref 0–99)
Triglycerides: 111 mg/dL (ref 0–149)
VLDL Cholesterol Cal: 20 mg/dL (ref 5–40)

## 2023-09-10 LAB — TSH: TSH: 2.07 u[IU]/mL (ref 0.450–4.500)

## 2023-09-10 LAB — INSULIN, RANDOM: INSULIN: 67.1 u[IU]/mL — ABNORMAL HIGH (ref 2.6–24.9)

## 2023-09-10 LAB — FOLATE: Folate: 9.9 ng/mL (ref >3.0)

## 2023-09-10 LAB — VITAMIN D 25 HYDROXY (VIT D DEFICIENCY, FRACTURES): Vit D, 25-Hydroxy: 34.3 ng/mL (ref 30.0–100.0)

## 2023-09-10 LAB — VITAMIN B12: Vitamin B-12: 335 pg/mL (ref 232–1245)

## 2023-09-10 LAB — T3: T3, Total: 138 ng/dL (ref 71–180)

## 2023-09-10 LAB — T4, FREE: Free T4: 1.43 ng/dL (ref 0.82–1.77)

## 2023-09-23 ENCOUNTER — Encounter (INDEPENDENT_AMBULATORY_CARE_PROVIDER_SITE_OTHER): Payer: Self-pay | Admitting: Family Medicine

## 2023-09-23 ENCOUNTER — Ambulatory Visit (INDEPENDENT_AMBULATORY_CARE_PROVIDER_SITE_OTHER): Admitting: Family Medicine

## 2023-09-23 VITALS — BP 115/74 | HR 78 | Temp 98.7°F | Ht 62.0 in | Wt 232.0 lb

## 2023-09-23 DIAGNOSIS — E785 Hyperlipidemia, unspecified: Secondary | ICD-10-CM

## 2023-09-23 DIAGNOSIS — E1122 Type 2 diabetes mellitus with diabetic chronic kidney disease: Secondary | ICD-10-CM

## 2023-09-23 DIAGNOSIS — N183 Chronic kidney disease, stage 3 unspecified: Secondary | ICD-10-CM | POA: Diagnosis not present

## 2023-09-23 DIAGNOSIS — Z7985 Long-term (current) use of injectable non-insulin antidiabetic drugs: Secondary | ICD-10-CM

## 2023-09-23 DIAGNOSIS — E559 Vitamin D deficiency, unspecified: Secondary | ICD-10-CM

## 2023-09-23 DIAGNOSIS — E669 Obesity, unspecified: Secondary | ICD-10-CM

## 2023-09-23 DIAGNOSIS — E538 Deficiency of other specified B group vitamins: Secondary | ICD-10-CM

## 2023-09-23 DIAGNOSIS — E7800 Pure hypercholesterolemia, unspecified: Secondary | ICD-10-CM

## 2023-09-23 DIAGNOSIS — Z6841 Body Mass Index (BMI) 40.0 and over, adult: Secondary | ICD-10-CM

## 2023-09-23 DIAGNOSIS — Z7984 Long term (current) use of oral hypoglycemic drugs: Secondary | ICD-10-CM

## 2023-09-23 DIAGNOSIS — N1831 Chronic kidney disease, stage 3a: Secondary | ICD-10-CM

## 2023-09-23 MED ORDER — VITAMIN B-12 1000 MCG PO TABS
1000.0000 ug | ORAL_TABLET | Freq: Every day | ORAL | 0 refills | Status: DC
Start: 2023-09-23 — End: 2023-11-03

## 2023-09-23 MED ORDER — TIRZEPATIDE 12.5 MG/0.5ML ~~LOC~~ SOAJ
12.5000 mg | SUBCUTANEOUS | 0 refills | Status: DC
Start: 2023-09-23 — End: 2023-10-15

## 2023-09-23 NOTE — Progress Notes (Signed)
 Office: [phone]  /  Fax: [phone]  WEIGHT SUMMARY AND BIOMETRICS  Anthropometric Measurements Height: 5' 2 (1.575 m) Weight: 232 lb (105.2 kg) BMI (Calculated): 42.42 Weight at Last Visit: 239lb Weight Lost Since Last Visit: 7lb Weight Gained Since Last Visit: 0lb Starting Weight: 239lb Total Weight Loss (lbs): 7 lb (3.175 kg) Peak Weight: 253lb Waist Measurement : 53 inches   Body Composition  Body Fat %: 51.7 % Fat Mass (lbs): 120 lbs Muscle Mass (lbs): 106.4 lbs Total Body Water (lbs): 82.4 lbs Visceral Fat Rating : 17   Other Clinical Data RMR: 1642 Fasting: No Labs: no Today's Visit #: 2 Starting Date: 09/09/23 Comments: Second visit    Chief Complaint: OBESITY   Discussed the use of AI scribe software for clinical note transcription with the patient, who gave verbal consent to proceed.  History of Present Illness Olivia Moreno is a 58 year old female with obesity who presents for a follow-up on her obesity treatment plan.  She follows the category two eating plan 97% of the time and has lost seven pounds in the last two weeks. Despite adherence, she experiences constant hunger. She finds the sandwiches repetitive and cannot tolerate eggs, opting for yogurt instead. Dinner is manageable with protein and vegetables, although weighing food is tedious.  Recent lab results include creatinine levels and a GFR that is higher than previous values. Recent lab results show an LDL of 67, HDL of 50, and normal triglycerides. Vitamin D  level is 30, and she is on a weekly prescription for it. She forgets to take her vitamin D  regularly. B12 level is 335, which is lower than desired, and she is on metformin , which may contribute to this deficiency.  Current medications include Crestor  for cholesterol and metformin  for diabetes. She has been on Ozempic  for a couple of years but reports no significant hunger control from it. She is out of her current supply of  Ozempic .  She cannot tolerate eggs and is tired of sandwiches.      PHYSICAL EXAM:  Blood pressure 115/74, pulse 78, temperature 98.7 F (37.1 C), height 5' 2 (1.575 m), weight 232 lb (105.2 kg), last menstrual period 05/20/2010, SpO2 97%. Body mass index is 42.43 kg/m.  DIAGNOSTIC DATA REVIEWED:  BMET    Component Value Date/Time   NA 140 09/09/2023 1009   K 5.2 09/09/2023 1009   CL 104 09/09/2023 1009   CO2 20 09/09/2023 1009   GLUCOSE 96 09/09/2023 1009   GLUCOSE 102 (H) 08/21/2023 0956   BUN 21 09/09/2023 1009   CREATININE 1.17 (H) 09/09/2023 1009   CREATININE 0.83 10/04/2011 1439   CALCIUM  10.1 09/09/2023 1009   Lab Results  Component Value Date   HGBA1C 5.8 07/25/2023   HGBA1C 6.2 11/15/2013   Lab Results  Component Value Date   INSULIN  67.1 (H) 09/09/2023   Lab Results  Component Value Date   TSH 2.070 09/09/2023   CBC    Component Value Date/Time   WBC 6.0 09/09/2023 1009   WBC 5.8 04/19/2022 0801   RBC 4.47 09/09/2023 1009   RBC 4.18 04/19/2022 0801   HGB 13.4 09/09/2023 1009   HCT 42.1 09/09/2023 1009   PLT 178 09/09/2023 1009   MCV 94 09/09/2023 1009   MCH 30.0 09/09/2023 1009   MCHC 31.8 09/09/2023 1009   MCHC 33.2 04/19/2022 0801   RDW 13.2 09/09/2023 1009   Iron Studies    Component Value Date/Time   IRON 77 04/19/2022  0801   TIBC 350.0 04/19/2022 0801   FERRITIN 155.2 04/19/2022 0801   IRONPCTSAT 22.0 04/19/2022 0801   Lipid Panel     Component Value Date/Time   CHOL 137 09/09/2023 1009   TRIG 111 09/09/2023 1009   HDL 50 09/09/2023 1009   CHOLHDL 2.7 09/09/2023 1009   CHOLHDL 3 07/25/2023 0833   VLDL 21.0 07/25/2023 0833   LDLCALC 67 09/09/2023 1009   LDLDIRECT 148.0 05/08/2017 0806   Hepatic Function Panel     Component Value Date/Time   PROT 7.0 09/09/2023 1009   ALBUMIN 4.4 09/09/2023 1009   AST 27 09/09/2023 1009   ALT 25 09/09/2023 1009   ALKPHOS 106 09/09/2023 1009   BILITOT 0.5 09/09/2023 1009       Component Value Date/Time   TSH 2.070 09/09/2023 1009   Nutritional Lab Results  Component Value Date   VD25OH 34.3 09/09/2023   VD25OH 34.92 07/25/2023   VD25OH 28.01 (L) 04/28/2023     Assessment and Plan Assessment & Plan Obesity Following the category two eating plan 97% of the time, resulting in a 7-pound weight loss over two weeks. Reports constant hunger due to high insulin  levels from diabetes. Current plan may have underestimated caloric needs, leading to more weight loss than expected. Explained the role of insulin  in weight gain and hunger, and the importance of protein in managing weight and blood sugar levels. Discussed the switch from Ozempic  to Mounjaro  for better insulin  regulation and hunger control. - Increase caloric intake slightly while maintaining high protein intake. - Switch from Ozempic  to Mounjaro  to improve insulin  regulation and hunger control. - Provide additional breakfast options including [place] sausage and [place] bacon. - Encourage spreading protein intake throughout the day. - Advise on travel strategy focusing on protein and vegetables.  Type 2 diabetes mellitus Diabetes is well-controlled with an A1c of 5.8. Fasting insulin  levels are significantly elevated at 67, indicating insulin  resistance and increased pancreatic workload. Explained the importance of managing insulin  levels to prevent pancreatic burnout and the potential need for insulin  therapy in the future. Discussed the benefits of switching to Mounjaro , which may offer better insulin  sensitivity and reduced pancreatic workload compared to Ozempic . - Switch from Ozempic  to Mounjaro  to improve insulin  sensitivity and reduce pancreatic workload. - Continue metformin  once daily. - Monitor insulin  levels and adjust treatment as necessary.  Chronic kidney disease, stage 3 Improvements noted in creatinine and GFR levels. Emphasized the importance of hydration to support kidney function. Discussed  the impact of protein intake on kidney health, clarifying that current protein intake is appropriate given her kidney function. - Encourage adequate hydration to support kidney function. - Monitor kidney function regularly.  Hyperlipidemia Cholesterol levels are well-controlled with an LDL of 67, HDL of 50, and normal triglycerides. On a low dose of Crestor , contributing to cholesterol management along with dietary changes. - Continue Crestor  at current dose. - Monitor cholesterol levels regularly.  Vitamin D  deficiency Vitamin D  level is currently 30, an improvement but still below the optimal range of 50-60. On a weekly prescription of vitamin D  but reports difficulty remembering to take it. - Continue weekly prescription vitamin D . - Recommend using a pill organizer to improve adherence.  Vitamin B12 deficiency B12 level is 335, lower than desired. Deficiency may be exacerbated by metformin  use. Explained the importance of B12 for energy and metabolism and recommended supplementation. - Start prescription B12 supplement, 1000 mcg daily. - Continue B12-rich diet. - Re-evaluate B12 levels in 3  months.  I have personally spent 55 minutes total time today in preparation, patient care, and documentation for this visit, including the following: review of clinical lab tests; review of medical tests/procedures/services and extensive nutritional counseling for DM, obesity, HLD and CKD     She was informed of the importance of frequent follow up visits to maximize her success with intensive lifestyle modifications for her multiple health conditions.    Louann Penton, MD

## 2023-09-24 ENCOUNTER — Telehealth (INDEPENDENT_AMBULATORY_CARE_PROVIDER_SITE_OTHER): Payer: Self-pay

## 2023-09-24 NOTE — Telephone Encounter (Signed)
 Resubmitted PA with more clinic notes for pt mounjaro . Olivia Moreno (Key: [id number]) Mounjaro  12.5MG /0.5ML auto-injectors

## 2023-09-24 NOTE — Telephone Encounter (Signed)
 Pt was approve through CoverMyMeds Your PA request has been approved. Additional information will be provided in the approval communication.  (Message 1145). Authorization Expiration Date: September 24, 2026.

## 2023-09-24 NOTE — Telephone Encounter (Signed)
 Siennah Roussel (KeyBETHA PAL) Rx #: [id number] Mounjaro  12.5MG /0.5ML auto-injectors Pa has been started for patient Rx.

## 2023-09-25 ENCOUNTER — Other Ambulatory Visit: Payer: Self-pay | Admitting: Primary Care

## 2023-09-25 ENCOUNTER — Telehealth (INDEPENDENT_AMBULATORY_CARE_PROVIDER_SITE_OTHER): Payer: Self-pay

## 2023-09-25 DIAGNOSIS — E1165 Type 2 diabetes mellitus with hyperglycemia: Secondary | ICD-10-CM

## 2023-09-25 NOTE — Telephone Encounter (Signed)
 Called pt and inform her that she was approve.

## 2023-09-29 ENCOUNTER — Encounter (INDEPENDENT_AMBULATORY_CARE_PROVIDER_SITE_OTHER): Payer: Self-pay | Admitting: Family Medicine

## 2023-10-07 ENCOUNTER — Other Ambulatory Visit: Payer: Self-pay | Admitting: Primary Care

## 2023-10-07 DIAGNOSIS — M1A4710 Other secondary chronic gout, right ankle and foot, without tophus (tophi): Secondary | ICD-10-CM

## 2023-10-07 DIAGNOSIS — E782 Mixed hyperlipidemia: Secondary | ICD-10-CM

## 2023-10-11 ENCOUNTER — Other Ambulatory Visit: Payer: Self-pay | Admitting: Primary Care

## 2023-10-11 DIAGNOSIS — I10 Essential (primary) hypertension: Secondary | ICD-10-CM

## 2023-10-15 ENCOUNTER — Ambulatory Visit (INDEPENDENT_AMBULATORY_CARE_PROVIDER_SITE_OTHER): Admitting: Family Medicine

## 2023-10-15 ENCOUNTER — Encounter (INDEPENDENT_AMBULATORY_CARE_PROVIDER_SITE_OTHER): Payer: Self-pay | Admitting: Family Medicine

## 2023-10-15 VITALS — BP 106/68 | HR 73 | Temp 98.0°F | Ht 62.0 in | Wt 232.0 lb

## 2023-10-15 DIAGNOSIS — E669 Obesity, unspecified: Secondary | ICD-10-CM | POA: Diagnosis not present

## 2023-10-15 DIAGNOSIS — Z7985 Long-term (current) use of injectable non-insulin antidiabetic drugs: Secondary | ICD-10-CM

## 2023-10-15 DIAGNOSIS — E538 Deficiency of other specified B group vitamins: Secondary | ICD-10-CM

## 2023-10-15 DIAGNOSIS — Z6841 Body Mass Index (BMI) 40.0 and over, adult: Secondary | ICD-10-CM

## 2023-10-15 DIAGNOSIS — E1122 Type 2 diabetes mellitus with diabetic chronic kidney disease: Secondary | ICD-10-CM

## 2023-10-15 DIAGNOSIS — E66813 Obesity, class 3: Secondary | ICD-10-CM

## 2023-10-15 DIAGNOSIS — R5383 Other fatigue: Secondary | ICD-10-CM | POA: Diagnosis not present

## 2023-10-15 DIAGNOSIS — E119 Type 2 diabetes mellitus without complications: Secondary | ICD-10-CM | POA: Diagnosis not present

## 2023-10-15 MED ORDER — TIRZEPATIDE 12.5 MG/0.5ML ~~LOC~~ SOAJ: 12.5000 mg | SUBCUTANEOUS | 0 refills | Status: DC

## 2023-10-15 NOTE — Progress Notes (Signed)
 Office: [phone]  /  Fax: [phone]  WEIGHT SUMMARY AND BIOMETRICS  Anthropometric Measurements Height: 5' 2 (1.575 m) Weight: 232 lb (105.2 kg) BMI (Calculated): 42.42 Weight at Last Visit: 232 lb Weight Lost Since Last Visit: 0 Weight Gained Since Last Visit: 0 Starting Weight: 239 lb Total Weight Loss (lbs): 7 lb (3.175 kg) Peak Weight: 253 lb Waist Measurement : 53 inches   Body Composition  Body Fat %: 52.4 % Fat Mass (lbs): 121.6 lbs Muscle Mass (lbs): 104.8 lbs Total Body Water (lbs): 86.2 lbs Visceral Fat Rating : 17   Other Clinical Data A1c: 5.8 mg/dL RMR: 8357 Fasting: no Labs: no Today's Visit #: 3 Starting Date: 09/09/23    Chief Complaint: OBESITY   History of Present Illness Olivia Moreno is a 58 year old female with obesity and type 2 diabetes who presents for obesity treatment and progress assessment.  She has been following the category two eating plan approximately 85% of the time, focusing on protein, fruits, and vegetables. She often skips meals and does not hydrate adequately. She is not currently engaging in any exercise. Despite these challenges, she has maintained her weight over the past three weeks since her last visit.  She recently returned from a vacation in [place] , where she attempted to adhere to her eating plan. She managed to maintain her breakfast routine but found dinner more challenging, often eating out. She was mindful of her protein intake when possible and tried to make good choices when cooking at her [employer]. However, she indulged in sweets, specifically beignets, which she shared with her grandson. She also tried Research scientist (life sciences) for the first time, opting for a burger without fries.  She has been on Mounjaro  for a week, with her next injection scheduled for tonight. She experiences a significant reduction in hunger, describing her state as neither hungry nor satisfied. She has not yet started her B12 supplementation  due to availability issues but plans to purchase it over the counter.  She is seeking more lunch options as she is tired of sandwiches and has been consuming yogurt with added fruits like blueberries or strawberries. She dislikes [place] bacon and is unable to find chicken bacon. She wants variety in her meals while adhering to her eating plan.  She reports an improvement in energy levels, noting that she had enough energy to clean her house recently. Her knee pain has also decreased.      PHYSICAL EXAM:  Blood pressure 106/68, pulse 73, temperature 98 F (36.7 C), height 5' 2 (1.575 m), weight 232 lb (105.2 kg), last menstrual period 05/20/2010, SpO2 98%. Body mass index is 42.43 kg/m.  DIAGNOSTIC DATA REVIEWED:  BMET    Component Value Date/Time   NA 140 09/09/2023 1009   K 5.2 09/09/2023 1009   CL 104 09/09/2023 1009   CO2 20 09/09/2023 1009   GLUCOSE 96 09/09/2023 1009   GLUCOSE 102 (H) 08/21/2023 0956   BUN 21 09/09/2023 1009   CREATININE 1.17 (H) 09/09/2023 1009   CREATININE 0.83 10/04/2011 1439   CALCIUM  10.1 09/09/2023 1009   Lab Results  Component Value Date   HGBA1C 5.8 07/25/2023   HGBA1C 6.2 11/15/2013   Lab Results  Component Value Date   INSULIN  67.1 (H) 09/09/2023   Lab Results  Component Value Date   TSH 2.070 09/09/2023   CBC    Component Value Date/Time   WBC 6.0 09/09/2023 1009   WBC 5.8 04/19/2022 0801   RBC 4.47 09/09/2023  1009   RBC 4.18 04/19/2022 0801   HGB 13.4 09/09/2023 1009   HCT 42.1 09/09/2023 1009   PLT 178 09/09/2023 1009   MCV 94 09/09/2023 1009   MCH 30.0 09/09/2023 1009   MCHC 31.8 09/09/2023 1009   MCHC 33.2 04/19/2022 0801   RDW 13.2 09/09/2023 1009   Iron Studies    Component Value Date/Time   IRON 77 04/19/2022 0801   TIBC 350.0 04/19/2022 0801   FERRITIN 155.2 04/19/2022 0801   IRONPCTSAT 22.0 04/19/2022 0801   Lipid Panel     Component Value Date/Time   CHOL 137 09/09/2023 1009   TRIG 111 09/09/2023 1009    HDL 50 09/09/2023 1009   CHOLHDL 2.7 09/09/2023 1009   CHOLHDL 3 07/25/2023 0833   VLDL 21.0 07/25/2023 0833   LDLCALC 67 09/09/2023 1009   LDLDIRECT 148.0 05/08/2017 0806   Hepatic Function Panel     Component Value Date/Time   PROT 7.0 09/09/2023 1009   ALBUMIN 4.4 09/09/2023 1009   AST 27 09/09/2023 1009   ALT 25 09/09/2023 1009   ALKPHOS 106 09/09/2023 1009   BILITOT 0.5 09/09/2023 1009      Component Value Date/Time   TSH 2.070 09/09/2023 1009   Nutritional Lab Results  Component Value Date   VD25OH 34.3 09/09/2023   VD25OH 34.92 07/25/2023   VD25OH 28.01 (L) 04/28/2023     Assessment and Plan Assessment & Plan Obesity Obesity management with category two eating plan. Adheres to the plan 85% of the time, focusing on protein, fruits, and vegetables. Reports skipping meals and inadequate hydration. No current exercise regimen. Maintained weight over the last three weeks. Discussed challenges during vacation, including increased eating out and consumption of sweets. - Continue category two eating plan with focus on protein, fruits, and vegetables - Encourage hydration and regular meal consumption - Provide additional lunch options to prevent monotony - Encourage portion control and mindful eating, especially during vacations - Discuss strategies for meal planning and maintaining routine post-vacation  Type 2 diabetes mellitus without complications Type 2 diabetes management with Mounjaro . Started Mounjaro  one week ago, reports reduced hunger but not feeling satisfied. Anticipated increase in medication efficacy with subsequent doses. Discussed potential need for dose adjustment based on future assessments. - Continue Mounjaro  with weekly injections - Refill Mounjaro  prescription to prevent lapse in medication - Monitor response to Mounjaro  and consider dose adjustment at next visit - Encourage purchase and initiation of over-the-counter B12 supplementation  B12  deficiency She has not yet started B12 -Start B OTC 1000 mcg and recheck labs in 1-2 months  Fatigue Fatigue improving with better nutrition. Reports increased energy levels and reduced knee pain, likely due to weight management and reduced inflammation from dietary changes. - Continue current dietary plan to support energy levels and reduce inflammation    She was informed of the importance of frequent follow up visits to maximize her success with intensive lifestyle modifications for her multiple health conditions.    Louann Penton, MD

## 2023-10-28 LAB — HM DIABETES EYE EXAM

## 2023-10-30 ENCOUNTER — Encounter: Payer: Self-pay | Admitting: Primary Care

## 2023-11-03 ENCOUNTER — Ambulatory Visit (INDEPENDENT_AMBULATORY_CARE_PROVIDER_SITE_OTHER): Admitting: Family Medicine

## 2023-11-03 ENCOUNTER — Encounter (INDEPENDENT_AMBULATORY_CARE_PROVIDER_SITE_OTHER): Payer: Self-pay | Admitting: Family Medicine

## 2023-11-03 VITALS — BP 116/78 | HR 78 | Temp 98.2°F | Ht 62.0 in | Wt 224.0 lb

## 2023-11-03 DIAGNOSIS — N189 Chronic kidney disease, unspecified: Secondary | ICD-10-CM | POA: Diagnosis not present

## 2023-11-03 DIAGNOSIS — E785 Hyperlipidemia, unspecified: Secondary | ICD-10-CM

## 2023-11-03 DIAGNOSIS — T383X5A Adverse effect of insulin and oral hypoglycemic [antidiabetic] drugs, initial encounter: Secondary | ICD-10-CM

## 2023-11-03 DIAGNOSIS — Z7984 Long term (current) use of oral hypoglycemic drugs: Secondary | ICD-10-CM

## 2023-11-03 DIAGNOSIS — I129 Hypertensive chronic kidney disease with stage 1 through stage 4 chronic kidney disease, or unspecified chronic kidney disease: Secondary | ICD-10-CM

## 2023-11-03 DIAGNOSIS — E1122 Type 2 diabetes mellitus with diabetic chronic kidney disease: Secondary | ICD-10-CM | POA: Diagnosis not present

## 2023-11-03 DIAGNOSIS — K5900 Constipation, unspecified: Secondary | ICD-10-CM

## 2023-11-03 DIAGNOSIS — E669 Obesity, unspecified: Secondary | ICD-10-CM

## 2023-11-03 DIAGNOSIS — M25569 Pain in unspecified knee: Secondary | ICD-10-CM

## 2023-11-03 DIAGNOSIS — Z7985 Long-term (current) use of injectable non-insulin antidiabetic drugs: Secondary | ICD-10-CM

## 2023-11-03 DIAGNOSIS — E782 Mixed hyperlipidemia: Secondary | ICD-10-CM

## 2023-11-03 DIAGNOSIS — K5903 Drug induced constipation: Secondary | ICD-10-CM

## 2023-11-03 DIAGNOSIS — M25579 Pain in unspecified ankle and joints of unspecified foot: Secondary | ICD-10-CM

## 2023-11-03 DIAGNOSIS — E66813 Obesity, class 3: Secondary | ICD-10-CM

## 2023-11-03 DIAGNOSIS — M2550 Pain in unspecified joint: Secondary | ICD-10-CM

## 2023-11-03 DIAGNOSIS — I10 Essential (primary) hypertension: Secondary | ICD-10-CM

## 2023-11-03 DIAGNOSIS — Z6841 Body Mass Index (BMI) 40.0 and over, adult: Secondary | ICD-10-CM

## 2023-11-03 DIAGNOSIS — N1831 Chronic kidney disease, stage 3a: Secondary | ICD-10-CM

## 2023-11-03 MED ORDER — VITAMIN B-12 1000 MCG PO TABS: 1000.0000 ug | ORAL_TABLET | Freq: Every day | ORAL | 0 refills | Status: DC

## 2023-11-03 MED ORDER — TIRZEPATIDE 12.5 MG/0.5ML ~~LOC~~ SOAJ: 12.5000 mg | SUBCUTANEOUS | 0 refills | Status: DC

## 2023-11-03 NOTE — Progress Notes (Signed)
 Office: [phone]  /  Fax: [phone]  WEIGHT SUMMARY AND BIOMETRICS  Anthropometric Measurements Height: 5' 2 (1.575 m) Weight: 224 lb (101.6 kg) BMI (Calculated): 40.96 Weight at Last Visit: 232 lb Weight Lost Since Last Visit: 8 lb Weight Gained Since Last Visit: 0 Starting Weight: 239 lb Total Weight Loss (lbs): 15 lb (6.804 kg) Peak Weight: 253 lb Waist Measurement : 53 inches   Body Composition  Body Fat %: 49.7 % Fat Mass (lbs): 111.6 lbs Muscle Mass (lbs): 107 lbs Total Body Water (lbs): 78.4 lbs Visceral Fat Rating : 16   Other Clinical Data A1c: 5.8 mg/dL RMR: 8357 Fasting: yes Labs: no Today's Visit #: 4 Starting Date: 09/09/23    Chief Complaint: OBESITY   Discussed the use of AI scribe software for clinical note transcription with the patient, who gave verbal consent to proceed.  History of Present Illness Olivia Moreno is a 58 year old female who presents for obesity treatment and progress assessment.  She adheres to a category two eating plan 90% of the time, focusing on increasing fruit and vegetable intake, maintaining adequate hydration, and avoiding skipping meals. She struggles with consuming adequate protein, particularly during lunch on weekends. She has successfully lost eight pounds over the past three weeks.  Her exercise routine includes walking for 15 to 20 minutes five days a week. She experiences knee and ankle pain, especially over the past weekend, which may be related to increased physical activity.  She manages type 2 diabetes with metformin  and Mounjaro , alongside diet, exercise, and weight loss. Her hemoglobin A1c was 5.8 in June. She does not monitor her blood sugars at home and has not experienced hypoglycemia.  Hypertension is managed with diet, exercise, weight loss, and olmesartan . She has not experienced symptoms such as lightheadedness or dizziness.  Hyperlipidemia is managed by reducing dietary cholesterol and  taking Crestor  5 mg, which she tolerates well. Her LDL was 67 in July.  She experiences constipation the day after taking Mounjaro , for which she uses laxatives. This is the only day she experiences constipation.  She takes over-the-counter B12 supplements, ensuring a daily intake of 1000 mcg.  No issues with hunger and low blood sugars. She sometimes finds it challenging not to skip meals, particularly lunch on weekends.      PHYSICAL EXAM:  Blood pressure 116/78, pulse 78, temperature 98.2 F (36.8 C), height 5' 2 (1.575 m), weight 224 lb (101.6 kg), last menstrual period 05/20/2010, SpO2 100%. Body mass index is 40.97 kg/m.  DIAGNOSTIC DATA REVIEWED:  BMET    Component Value Date/Time   NA 140 09/09/2023 1009   K 5.2 09/09/2023 1009   CL 104 09/09/2023 1009   CO2 20 09/09/2023 1009   GLUCOSE 96 09/09/2023 1009   GLUCOSE 102 (H) 08/21/2023 0956   BUN 21 09/09/2023 1009   CREATININE 1.17 (H) 09/09/2023 1009   CREATININE 0.83 10/04/2011 1439   CALCIUM  10.1 09/09/2023 1009   Lab Results  Component Value Date   HGBA1C 5.8 07/25/2023   HGBA1C 6.2 11/15/2013   Lab Results  Component Value Date   INSULIN  67.1 (H) 09/09/2023   Lab Results  Component Value Date   TSH 2.070 09/09/2023   CBC    Component Value Date/Time   WBC 6.0 09/09/2023 1009   WBC 5.8 04/19/2022 0801   RBC 4.47 09/09/2023 1009   RBC 4.18 04/19/2022 0801   HGB 13.4 09/09/2023 1009   HCT 42.1 09/09/2023 1009   PLT  178 09/09/2023 1009   MCV 94 09/09/2023 1009   MCH 30.0 09/09/2023 1009   MCHC 31.8 09/09/2023 1009   MCHC 33.2 04/19/2022 0801   RDW 13.2 09/09/2023 1009   Iron Studies    Component Value Date/Time   IRON 77 04/19/2022 0801   TIBC 350.0 04/19/2022 0801   FERRITIN 155.2 04/19/2022 0801   IRONPCTSAT 22.0 04/19/2022 0801   Lipid Panel     Component Value Date/Time   CHOL 137 09/09/2023 1009   TRIG 111 09/09/2023 1009   HDL 50 09/09/2023 1009   CHOLHDL 2.7 09/09/2023 1009    CHOLHDL 3 07/25/2023 0833   VLDL 21.0 07/25/2023 0833   LDLCALC 67 09/09/2023 1009   LDLDIRECT 148.0 05/08/2017 0806   Hepatic Function Panel     Component Value Date/Time   PROT 7.0 09/09/2023 1009   ALBUMIN 4.4 09/09/2023 1009   AST 27 09/09/2023 1009   ALT 25 09/09/2023 1009   ALKPHOS 106 09/09/2023 1009   BILITOT 0.5 09/09/2023 1009      Component Value Date/Time   TSH 2.070 09/09/2023 1009   Nutritional Lab Results  Component Value Date   VD25OH 34.3 09/09/2023   VD25OH 34.92 07/25/2023   VD25OH 28.01 (L) 04/28/2023     Assessment and Plan Assessment & Plan Obesity Obesity management is ongoing with a category two eating plan, followed 90% of the time. She is working on increasing fruit and vegetable intake, hydrating adequately, and not skipping meals. She exercises 15-20 minutes five days a week, primarily walking, and has lost 8 pounds in the last three weeks. Protein intake is a challenge, especially on weekends. Strategies to increase protein intake were discussed, including a high-protein, low-calorie pudding recipe. Strengthening exercises are encouraged to prevent muscle mass loss due to weight loss. - Continue category two eating plan - Encourage increased fruit and vegetable intake - Ensure adequate hydration - Avoid skipping meals - Continue exercise regimen, increase strengthening exercises - Consider using a weighted vest for additional resistance - Discuss strategies to increase protein intake, including high-protein pudding recipe  Type 2 diabetes mellitus Type 2 diabetes is well controlled with metformin  and Mounjaro . Hemoglobin A1c was 5.8 in June. She is not experiencing hypoglycemia and is not currently checking blood sugars at home. The importance of not skipping meals to avoid nausea and ensure adequate nutrition was discussed. - Continue metformin  and Mounjaro  - Avoid skipping meals to prevent nausea and ensure adequate nutrition  Essential  hypertension Hypertension is well controlled with olmesartan , diet, exercise, and weight loss. Blood pressure today is 116/78. As weight loss continues, there may be a need to adjust medication if symptoms of hypotension occur. Kidney function is improving, likely due to good blood pressure control and dietary measures. - Continue olmesartan  - Monitor blood pressure regularly - Watch for symptoms of hypotension, especially when well hydrated - Continue dietary measures to support kidney function  Chronic kidney disease,  Chronic kidney disease is improving with dietary measures and good blood pressure control. Recent tests show improved kidney function. - Continue dietary measures to support kidney function - Maintain good blood pressure control, will continue to monitor  Hyperlipidemia Hyperlipidemia is well controlled with Crestor  5 mg and dietary modifications. Last LDL was 67 in July. She is tolerating Crestor  well. - Continue Crestor  5 mg - Maintain dietary modifications to reduce cholesterol intake  Constipation due to Mounjaro  Constipation occurs the day after Mounjaro  injection, likely due to slowed GI motility. She has been  using laxatives to manage symptoms. The use of Miralax on the day of and the day after the injection was recommended to prevent constipation without causing accidents. - Use Miralax on the day of and the day after Mounjaro  injection - Ensure adequate hydration  Knee and ankle pain Knee and ankle pain noted, possibly due to increased walking. Pain was significant over the weekend. Weighted vests were discussed as an alternative to ankle weights to avoid exacerbating ankle pain. - Monitor knee and ankle pain - Consider using a weighted vest instead of ankle weights      Marshell was informed of the importance of frequent follow up visits to maximize her success with intensive lifestyle modifications for her obesity and obesity related health conditions as  recommended by USPSTF and CMS guidelines   Louann Penton, MD

## 2023-11-18 ENCOUNTER — Encounter (INDEPENDENT_AMBULATORY_CARE_PROVIDER_SITE_OTHER): Payer: Self-pay | Admitting: Family Medicine

## 2023-11-18 ENCOUNTER — Ambulatory Visit (INDEPENDENT_AMBULATORY_CARE_PROVIDER_SITE_OTHER): Admitting: Family Medicine

## 2023-11-18 VITALS — BP 113/71 | HR 63 | Temp 97.8°F | Ht 62.0 in | Wt 224.0 lb

## 2023-11-18 DIAGNOSIS — N1831 Chronic kidney disease, stage 3a: Secondary | ICD-10-CM | POA: Diagnosis not present

## 2023-11-18 DIAGNOSIS — F5089 Other specified eating disorder: Secondary | ICD-10-CM | POA: Diagnosis not present

## 2023-11-18 DIAGNOSIS — Z7985 Long-term (current) use of injectable non-insulin antidiabetic drugs: Secondary | ICD-10-CM

## 2023-11-18 DIAGNOSIS — K5900 Constipation, unspecified: Secondary | ICD-10-CM

## 2023-11-18 DIAGNOSIS — Z6841 Body Mass Index (BMI) 40.0 and over, adult: Secondary | ICD-10-CM

## 2023-11-18 DIAGNOSIS — E1122 Type 2 diabetes mellitus with diabetic chronic kidney disease: Secondary | ICD-10-CM

## 2023-11-18 DIAGNOSIS — F3289 Other specified depressive episodes: Secondary | ICD-10-CM

## 2023-11-18 DIAGNOSIS — K5901 Slow transit constipation: Secondary | ICD-10-CM

## 2023-11-18 DIAGNOSIS — E66813 Obesity, class 3: Secondary | ICD-10-CM

## 2023-11-18 MED ORDER — TIRZEPATIDE 12.5 MG/0.5ML ~~LOC~~ SOAJ: 12.5000 mg | SUBCUTANEOUS | 0 refills | Status: DC

## 2023-11-18 NOTE — Progress Notes (Signed)
 Office: [phone]  /  Fax: [phone]  WEIGHT SUMMARY AND BIOMETRICS  Anthropometric Measurements Height: 5' 2 (1.575 m) Weight: 224 lb (101.6 kg) BMI (Calculated): 40.96 Weight at Last Visit: 224 lb Weight Lost Since Last Visit: 0 Weight Gained Since Last Visit: 0 Starting Weight: 239 lb Total Weight Loss (lbs): 15 lb (6.804 kg) Peak Weight: 253 lb Waist Measurement : 53 inches   Body Composition  Body Fat %: 51.5 % Fat Mass (lbs): 115.4 lbs Muscle Mass (lbs): 103 lbs Total Body Water (lbs): 85.4 lbs Visceral Fat Rating : 16   Other Clinical Data A1c: 5.8 mg/dL RMR: 8357 Fasting: no Labs: no Today's Visit #: 5 Starting Date: 09/09/23    Chief Complaint: OBESITY  History of Present Illness Olivia Moreno is a 58 year old female with obesity and type 2 diabetes who presents for obesity treatment and progress assessment.  She has been following a category two eating plan approximately 85% of the time and has maintained her weight over the past two weeks. She struggles with adherence, particularly during dinner and when dining out, often reverting to previous habits when busy or bored.  She is not currently engaging in any exercise. Her hunger levels have not been a significant issue recently. She has been using Mounjaro  without experiencing nausea or vomiting. Initially, she experienced constipation the day after taking Mounjaro  but started taking Gruens vitamins, which have helped regulate her bowel movements. Her bowel movements are now regular and not forced with the vitamins.  She has no issues with blood sugar levels, with no episodes of hypoglycemia or hyperglycemia. She has not experienced cravings for sweets as she used to. She enjoys [place] bars as a treat, particularly the peanut butter flavor, and does not consume them every night.  She is planning a trip to [facility] for her anniversary, where she will be staying with her brother. She has informed her  family about her focus on eating healthier and plans to bring her own breakfast items like yogurts to help maintain her eating plan while away.      PHYSICAL EXAM:  Blood pressure 113/71, pulse 63, temperature 97.8 F (36.6 C), height 5' 2 (1.575 m), weight 224 lb (101.6 kg), last menstrual period 05/20/2010, SpO2 98%. Body mass index is 40.97 kg/m.  DIAGNOSTIC DATA REVIEWED:  BMET    Component Value Date/Time   NA 140 09/09/2023 1009   K 5.2 09/09/2023 1009   CL 104 09/09/2023 1009   CO2 20 09/09/2023 1009   GLUCOSE 96 09/09/2023 1009   GLUCOSE 102 (H) 08/21/2023 0956   BUN 21 09/09/2023 1009   CREATININE 1.17 (H) 09/09/2023 1009   CREATININE 0.83 10/04/2011 1439   CALCIUM  10.1 09/09/2023 1009   Lab Results  Component Value Date   HGBA1C 5.8 07/25/2023   HGBA1C 6.2 11/15/2013   Lab Results  Component Value Date   INSULIN  67.1 (H) 09/09/2023   Lab Results  Component Value Date   TSH 2.070 09/09/2023   CBC    Component Value Date/Time   WBC 6.0 09/09/2023 1009   WBC 5.8 04/19/2022 0801   RBC 4.47 09/09/2023 1009   RBC 4.18 04/19/2022 0801   HGB 13.4 09/09/2023 1009   HCT 42.1 09/09/2023 1009   PLT 178 09/09/2023 1009   MCV 94 09/09/2023 1009   MCH 30.0 09/09/2023 1009   MCHC 31.8 09/09/2023 1009   MCHC 33.2 04/19/2022 0801   RDW 13.2 09/09/2023 1009   Iron Studies  Component Value Date/Time   IRON 77 04/19/2022 0801   TIBC 350.0 04/19/2022 0801   FERRITIN 155.2 04/19/2022 0801   IRONPCTSAT 22.0 04/19/2022 0801   Lipid Panel     Component Value Date/Time   CHOL 137 09/09/2023 1009   TRIG 111 09/09/2023 1009   HDL 50 09/09/2023 1009   CHOLHDL 2.7 09/09/2023 1009   CHOLHDL 3 07/25/2023 0833   VLDL 21.0 07/25/2023 0833   LDLCALC 67 09/09/2023 1009   LDLDIRECT 148.0 05/08/2017 0806   Hepatic Function Panel     Component Value Date/Time   PROT 7.0 09/09/2023 1009   ALBUMIN 4.4 09/09/2023 1009   AST 27 09/09/2023 1009   ALT 25 09/09/2023  1009   ALKPHOS 106 09/09/2023 1009   BILITOT 0.5 09/09/2023 1009      Component Value Date/Time   TSH 2.070 09/09/2023 1009   Nutritional Lab Results  Component Value Date   VD25OH 34.3 09/09/2023   VD25OH 34.92 07/25/2023   VD25OH 28.01 (L) 04/28/2023     Assessment and Plan Assessment & Plan Obesity Class 3 obesity with well-managed weight over the past two weeks. She adheres to the category two eating plan 85% of the time and is not currently exercising. Challenges exist with dinner meals and eating out. Eating out is not conducive to weight loss, but maintaining weight is positive. Lean meat equivalents introduced to meet protein goals and maintain meal interest. - Incorporate lean meat equivalents into meals - Provide recipes for [place] cheesy chicken and bruschetta chicken - Encourage protein and vegetable focus when eating out - Advise on managing eating out during vacation  Type 2 diabetes mellitus Type 2 diabetes mellitus. Blood sugar levels are well-controlled with no hypoglycemia or hyperglycemia. Mounjaro  is used without adverse effects. - Refill Mounjaro  prescription - Continue diet, exercise and weight loss as discussed today as an important part of the treatment plan   Emotional eating behaviors Emotional eating behaviors addressed with strategies to avoid deprivation while making healthier choices. Emphasized maintaining healthy eating habits during vacation and social situations. Mounjaro  reduces cravings, especially for sweets. - Discuss strategies for managing emotional eating during vacation - Encourage communication with family about healthy eating goals  Constipation Constipation was previously reported after taking Mounjaro . Gruens vitamins help regulate bowel movements. Less frequent bowel movements are normal with weight loss medications, but hard and painful stools indicate constipation. - Continue Gruens vitamins if effective - Monitor bowel movement  regularity and report issues - Continue to work on hydration and higher fiber foods  Follow-Up Follow-up appointments are scheduled for October and November. Transitioning to appointments every three weeks instead of every two weeks. - Schedule December follow-up appointment     Adean was informed of the importance of frequent follow up visits to maximize her success with intensive lifestyle modifications for her obesity and obesity related health conditions as recommended by USPSTF and CMS guidelines   Louann Penton, MD

## 2023-11-24 ENCOUNTER — Other Ambulatory Visit: Payer: Self-pay | Admitting: Primary Care

## 2023-11-24 DIAGNOSIS — M1A09X0 Idiopathic chronic gout, multiple sites, without tophus (tophi): Secondary | ICD-10-CM

## 2023-12-08 ENCOUNTER — Other Ambulatory Visit: Payer: Self-pay | Admitting: Primary Care

## 2023-12-08 DIAGNOSIS — M1A09X0 Idiopathic chronic gout, multiple sites, without tophus (tophi): Secondary | ICD-10-CM

## 2023-12-10 ENCOUNTER — Ambulatory Visit (INDEPENDENT_AMBULATORY_CARE_PROVIDER_SITE_OTHER): Admitting: Family Medicine

## 2023-12-14 ENCOUNTER — Other Ambulatory Visit: Payer: Self-pay | Admitting: Primary Care

## 2023-12-14 DIAGNOSIS — M1A09X0 Idiopathic chronic gout, multiple sites, without tophus (tophi): Secondary | ICD-10-CM

## 2023-12-31 ENCOUNTER — Encounter (INDEPENDENT_AMBULATORY_CARE_PROVIDER_SITE_OTHER): Payer: Self-pay | Admitting: Family Medicine

## 2023-12-31 ENCOUNTER — Ambulatory Visit (INDEPENDENT_AMBULATORY_CARE_PROVIDER_SITE_OTHER): Payer: Self-pay | Admitting: Family Medicine

## 2023-12-31 VITALS — BP 126/82 | HR 74 | Temp 98.0°F | Ht 62.0 in | Wt 220.0 lb

## 2023-12-31 DIAGNOSIS — E669 Obesity, unspecified: Secondary | ICD-10-CM

## 2023-12-31 DIAGNOSIS — E1122 Type 2 diabetes mellitus with diabetic chronic kidney disease: Secondary | ICD-10-CM | POA: Diagnosis not present

## 2023-12-31 DIAGNOSIS — M5441 Lumbago with sciatica, right side: Secondary | ICD-10-CM | POA: Diagnosis not present

## 2023-12-31 DIAGNOSIS — Z6841 Body Mass Index (BMI) 40.0 and over, adult: Secondary | ICD-10-CM

## 2023-12-31 DIAGNOSIS — Z7985 Long-term (current) use of injectable non-insulin antidiabetic drugs: Secondary | ICD-10-CM

## 2023-12-31 DIAGNOSIS — N189 Chronic kidney disease, unspecified: Secondary | ICD-10-CM | POA: Diagnosis not present

## 2023-12-31 DIAGNOSIS — N1831 Chronic kidney disease, stage 3a: Secondary | ICD-10-CM

## 2023-12-31 MED ORDER — TIRZEPATIDE 12.5 MG/0.5ML ~~LOC~~ SOAJ: 12.5000 mg | SUBCUTANEOUS | 0 refills | Status: DC

## 2023-12-31 NOTE — Progress Notes (Signed)
 Office: [phone]  /  Fax: [phone]  WEIGHT SUMMARY AND BIOMETRICS  Anthropometric Measurements Height: 5' 2 (1.575 m) Weight: 220 lb (99.8 kg) BMI (Calculated): 40.23 Weight at Last Visit: 224 lb Weight Lost Since Last Visit: 4 lb Weight Gained Since Last Visit: 0 Starting Weight: 239 lb Total Weight Loss (lbs): 19 lb (8.618 kg) Peak Weight: 253 lb Waist Measurement : 53 inches   Body Composition  Body Fat %: 50.5 % Fat Mass (lbs): 111.4 lbs Muscle Mass (lbs): 103.4 lbs Total Body Water (lbs): 81.4 lbs Visceral Fat Rating : 16   Other Clinical Data A1c: 5.8 mg/dL RMR: 8357 Fasting: no Labs: no Today's Visit #: 6 Starting Date: 09/09/23    Chief Complaint: OBESITY    History of Present Illness Olivia Moreno is a 58 year old female who presents for a follow-up on her weight management treatment plan.  She is adhering to a category two eating plan 85% of the time and has lost four pounds over the last six weeks. She feels bored with her current food options, particularly at dinner, as she often repeats meals. She enjoys her breakfast of Greek yogurt with blueberries and granola and finds lunch manageable with leftovers or sandwiches, though she misses having chips. She cannot eat pork or beef, so her protein sources are limited to chicken, salmon, and shrimp, with her husband preferring not to have salmon weekly.  She is not currently following an exercise regimen. She finds it challenging to drink water throughout the day at work and has tried various methods to increase her intake.  She experiences a deep pain in her right side, occurring at night and upon waking, radiating down her side and into her leg when she first stands up. This pain has been present for about two weeks, is improving, but still occurs nightly. It does not affect her during the day.  She is currently taking Mounjaro  without issues. She has successfully avoided fast food, which she  used to consume frequently, and is proud of this change. She has not had any fast food from places like [employer] or [employer] and does not miss it.      PHYSICAL EXAM:  Blood pressure 126/82, pulse 74, temperature 98 F (36.7 C), height 5' 2 (1.575 m), weight 220 lb (99.8 kg), last menstrual period 05/20/2010, SpO2 97%. Body mass index is 40.24 kg/m.  DIAGNOSTIC DATA REVIEWED:  BMET    Component Value Date/Time   NA 140 09/09/2023 1009   K 5.2 09/09/2023 1009   CL 104 09/09/2023 1009   CO2 20 09/09/2023 1009   GLUCOSE 96 09/09/2023 1009   GLUCOSE 102 (H) 08/21/2023 0956   BUN 21 09/09/2023 1009   CREATININE 1.17 (H) 09/09/2023 1009   CREATININE 0.83 10/04/2011 1439   CALCIUM  10.1 09/09/2023 1009   Lab Results  Component Value Date   HGBA1C 5.8 07/25/2023   HGBA1C 6.2 11/15/2013   Lab Results  Component Value Date   INSULIN  67.1 (H) 09/09/2023   Lab Results  Component Value Date   TSH 2.070 09/09/2023   CBC    Component Value Date/Time   WBC 6.0 09/09/2023 1009   WBC 5.8 04/19/2022 0801   RBC 4.47 09/09/2023 1009   RBC 4.18 04/19/2022 0801   HGB 13.4 09/09/2023 1009   HCT 42.1 09/09/2023 1009   PLT 178 09/09/2023 1009   MCV 94 09/09/2023 1009   MCH 30.0 09/09/2023 1009   MCHC 31.8 09/09/2023 1009  MCHC 33.2 04/19/2022 0801   RDW 13.2 09/09/2023 1009   Iron Studies    Component Value Date/Time   IRON 77 04/19/2022 0801   TIBC 350.0 04/19/2022 0801   FERRITIN 155.2 04/19/2022 0801   IRONPCTSAT 22.0 04/19/2022 0801   Lipid Panel     Component Value Date/Time   CHOL 137 09/09/2023 1009   TRIG 111 09/09/2023 1009   HDL 50 09/09/2023 1009   CHOLHDL 2.7 09/09/2023 1009   CHOLHDL 3 07/25/2023 0833   VLDL 21.0 07/25/2023 0833   LDLCALC 67 09/09/2023 1009   LDLDIRECT 148.0 05/08/2017 0806   Hepatic Function Panel     Component Value Date/Time   PROT 7.0 09/09/2023 1009   ALBUMIN 4.4 09/09/2023 1009   AST 27 09/09/2023 1009   ALT 25 09/09/2023  1009   ALKPHOS 106 09/09/2023 1009   BILITOT 0.5 09/09/2023 1009      Component Value Date/Time   TSH 2.070 09/09/2023 1009   Nutritional Lab Results  Component Value Date   VD25OH 34.3 09/09/2023   VD25OH 34.92 07/25/2023   VD25OH 28.01 (L) 04/28/2023     Assessment and Plan Assessment & Plan Onesity Following a category two eating plan 85% of the time and has lost four pounds in the last six weeks. Reports boredom with current food choices, particularly dinner. No issues with Mounjaro  reported. - Provided recipes for high-protein dinners with calorie range of 400-550 calories and at least 40 grams of protein. - Encouraged exploration of new seafood options like orange roughy. - Advised on hydration with at least 60 ounces of water daily. - Discussed strategies for managing Thanksgiving meal to prevent overeating.  Type 2 diabetes mellitus with diabetic chronic kidney disease Creatinine levels have been improving. No issues with Mounjaro  reported. - Refilled Mounjaro  prescription at [facility] in [place]. - Advised on hydration to support kidney health.  Right-sided lumbago with sciatica Experiencing right-sided low back pain with sciatica, primarily at night and upon waking. Pain radiates down the leg but resolves with movement. Symptoms have been present for about two weeks and are improving. Differential includes muscle spasms, pinched nerve, arthritis, or disc problems. - Referred to sports medicine specialist, Dr. Joane, for further evaluation. - Advised on hydration and posture to alleviate symptoms.    Olivia Moreno was counseled on the importance of maintaining healthy lifestyle habits, including balanced nutrition, regular physical activity, and behavioral modifications, while taking antiobesity medication.  Patient verbalized understanding that medication is an adjunct to, not a replacement for, lifestyle changes and that the long-term success and weight maintenance depend on  continued adherence to these strategies.   Olivia Moreno was informed of the importance of frequent follow up visits to maximize her success with intensive lifestyle modifications for her obesity and obesity related health conditions as recommended by USPSTF and CMS guidelines   Louann Penton, MD

## 2024-01-05 ENCOUNTER — Other Ambulatory Visit (INDEPENDENT_AMBULATORY_CARE_PROVIDER_SITE_OTHER): Payer: Self-pay | Admitting: Family Medicine

## 2024-01-21 ENCOUNTER — Ambulatory Visit (INDEPENDENT_AMBULATORY_CARE_PROVIDER_SITE_OTHER): Payer: Self-pay | Admitting: Family Medicine

## 2024-01-22 ENCOUNTER — Encounter (INDEPENDENT_AMBULATORY_CARE_PROVIDER_SITE_OTHER): Payer: Self-pay | Admitting: Family Medicine

## 2024-01-22 ENCOUNTER — Ambulatory Visit (INDEPENDENT_AMBULATORY_CARE_PROVIDER_SITE_OTHER): Admitting: Family Medicine

## 2024-01-22 VITALS — BP 120/76 | HR 72 | Temp 97.7°F | Ht 62.0 in | Wt 218.0 lb

## 2024-01-22 DIAGNOSIS — E538 Deficiency of other specified B group vitamins: Secondary | ICD-10-CM | POA: Diagnosis not present

## 2024-01-22 DIAGNOSIS — I129 Hypertensive chronic kidney disease with stage 1 through stage 4 chronic kidney disease, or unspecified chronic kidney disease: Secondary | ICD-10-CM

## 2024-01-22 DIAGNOSIS — E1122 Type 2 diabetes mellitus with diabetic chronic kidney disease: Secondary | ICD-10-CM

## 2024-01-22 DIAGNOSIS — Z7985 Long-term (current) use of injectable non-insulin antidiabetic drugs: Secondary | ICD-10-CM

## 2024-01-22 DIAGNOSIS — E669 Obesity, unspecified: Secondary | ICD-10-CM

## 2024-01-22 DIAGNOSIS — N1831 Chronic kidney disease, stage 3a: Secondary | ICD-10-CM

## 2024-01-22 DIAGNOSIS — Z6839 Body mass index (BMI) 39.0-39.9, adult: Secondary | ICD-10-CM | POA: Insufficient documentation

## 2024-01-22 DIAGNOSIS — N183 Chronic kidney disease, stage 3 unspecified: Secondary | ICD-10-CM

## 2024-01-22 MED ORDER — TIRZEPATIDE 12.5 MG/0.5ML ~~LOC~~ SOAJ: 12.5000 mg | SUBCUTANEOUS | 0 refills | Status: DC

## 2024-01-22 MED ORDER — VITAMIN B-12 1000 MCG PO TABS: 1000.0000 ug | ORAL_TABLET | Freq: Every day | ORAL | 0 refills | Status: AC

## 2024-01-22 NOTE — Progress Notes (Signed)
 Office: [phone]  /  Fax: [phone]  WEIGHT SUMMARY AND BIOMETRICS  Anthropometric Measurements Height: 5' 2 (1.575 m) Weight: 218 lb (98.9 kg) BMI (Calculated): 39.86 Weight at Last Visit: 220 lb Weight Lost Since Last Visit: 2 lb Weight Gained Since Last Visit: 0 Starting Weight: 239 lb Total Weight Loss (lbs): 21 lb (9.526 kg) Peak Weight: 253 lb Waist Measurement : 53 inches   Body Composition  Body Fat %: 50.8 % Fat Mass (lbs): 111.2 lbs Muscle Mass (lbs): 102.2 lbs Total Body Water (lbs): 82 lbs Visceral Fat Rating : 16   Other Clinical Data A1c: 5.8 mg/dL RMR: [id number] Fasting: no Labs: no Today's Visit #: 7 Starting Date: 09/09/23 Comments: cat 2    Chief Complaint: OBESITY    History of Present Illness Olivia Moreno is a 58 year old female with obesity, type 2 diabetes, and stage 3 chronic kidney disease who presents for obesity treatment plan assessment and progress evaluation.  She is adhering to the category two eating plan 85% of the time, consuming the recommended amount of protein, and increasing her intake of fruits and vegetables. She is making an effort not to skip meals and generally sleeps seven to nine hours per night. She has lost two pounds over the last three weeks, including over Thanksgiving.  She is not currently engaging in any exercise. She is planning a cruise to the [place] next week.  She reports taking metformin  and Mounjaro  for her type 2 diabetes, with the latter at a dose of 12.5 mg weekly. She requests a refill of Mounjaro . She reports she is not currently using insulin  for her diabetes.  She is being treated for B12 deficiency with a prescription of B12 at 1000 mcg daily and requests a refill.  She reports taking olmesartan  40 mg daily for hypertension.      PHYSICAL EXAM:  Blood pressure 120/76, pulse 72, temperature 97.7 F (36.5 C), height 5' 2 (1.575 m), weight 218 lb (98.9 kg), last menstrual  period 05/20/2010, SpO2 99%. Body mass index is 39.87 kg/m.  DIAGNOSTIC DATA REVIEWED:  BMET    Component Value Date/Time   NA 140 09/09/2023 1009   K 5.2 09/09/2023 1009   CL 104 09/09/2023 1009   CO2 20 09/09/2023 1009   GLUCOSE 96 09/09/2023 1009   GLUCOSE 102 (H) 08/21/2023 0956   BUN 21 09/09/2023 1009   CREATININE 1.17 (H) 09/09/2023 1009   CREATININE 0.83 10/04/2011 1439   CALCIUM  10.1 09/09/2023 1009   Lab Results  Component Value Date   HGBA1C 5.8 07/25/2023   HGBA1C 6.2 11/15/2013   Lab Results  Component Value Date   INSULIN  67.1 (H) 09/09/2023   Lab Results  Component Value Date   TSH 2.070 09/09/2023   CBC    Component Value Date/Time   WBC 6.0 09/09/2023 1009   WBC 5.8 04/19/2022 0801   RBC 4.47 09/09/2023 1009   RBC 4.18 04/19/2022 0801   HGB 13.4 09/09/2023 1009   HCT 42.1 09/09/2023 1009   PLT 178 09/09/2023 1009   MCV 94 09/09/2023 1009   MCH 30.0 09/09/2023 1009   MCHC 31.8 09/09/2023 1009   MCHC 33.2 04/19/2022 0801   RDW 13.2 09/09/2023 1009   Iron Studies    Component Value Date/Time   IRON 77 04/19/2022 0801   TIBC 350.0 04/19/2022 0801   FERRITIN 155.2 04/19/2022 0801   IRONPCTSAT 22.0 04/19/2022 0801   Lipid Panel     Component  Value Date/Time   CHOL 137 09/09/2023 1009   TRIG 111 09/09/2023 1009   HDL 50 09/09/2023 1009   CHOLHDL 2.7 09/09/2023 1009   CHOLHDL 3 07/25/2023 0833   VLDL 21.0 07/25/2023 0833   LDLCALC 67 09/09/2023 1009   LDLDIRECT 148.0 05/08/2017 0806   Hepatic Function Panel     Component Value Date/Time   PROT 7.0 09/09/2023 1009   ALBUMIN 4.4 09/09/2023 1009   AST 27 09/09/2023 1009   ALT 25 09/09/2023 1009   ALKPHOS 106 09/09/2023 1009   BILITOT 0.5 09/09/2023 1009      Component Value Date/Time   TSH 2.070 09/09/2023 1009   Nutritional Lab Results  Component Value Date   VD25OH 34.3 09/09/2023   VD25OH 34.92 07/25/2023   VD25OH 28.01 (L) 04/28/2023     Assessment and  Plan Assessment & Plan Obesity Management is ongoing with a focus on dietary modifications and weight loss. She adheres to the category two eating plan 85% of the time, consumes recommended protein, and increases fruits and vegetables. She avoids skipping meals and maintains 7-9 hours of sleep per night. She has lost 2 pounds over the last three weeks, including during Thanksgiving, which is commendable. She is not currently exercising but is considering strategies for future exercise routines. Discussed strategies for maintaining weight loss during upcoming cruise, including dining room meals, limiting alcohol, and increasing physical activity. - Continue category two eating plan - Encouraged exercise routine focusing on strengthening and low-impact cardio - Discussed strategies for maintaining weight loss during cruise  Type 2 diabetes mellitus with stage 3 chronic kidney disease Type 2 diabetes is managed with metformin  and Mounjaro . She is not on long-term insulin  therapy. Her diabetes management is integrated with her obesity treatment plan. - Refilled Mounjaro  12.5 mg weekly - Continue diet, exercise and weight loss as discussed today as an important part of the treatment plan   Essential hypertension Hypertension is well-controlled with olmesartan  40 mg daily. Blood pressure today is 120/76 mmHg. Management includes diet, exercise, and weight loss. - Continue olmesartan  40 mg daily - Continue diet, exercise and weight loss as discussed today as an important part of the treatment plan   Vitamin B12 deficiency Managed with prescription B12 1000 mcg daily. - Refilled B12 1000 mcg daily      Patients who are on anti-obesity medications are counseled on the importance of maintaining healthy lifestyle habits, including balanced nutrition, regular physical activity, and behavioral modifications,  Medication is an adjunct to, not a replacement for, lifestyle changes and that the long-term  success and weight maintenance depend on continued adherence to these strategies.   Lenetta was informed of the importance of frequent follow up visits to maximize her success with intensive lifestyle modifications for her obesity and obesity related health conditions as recommended by USPSTF and CMS guidelines Louann Penton, MD

## 2024-01-27 ENCOUNTER — Ambulatory Visit: Admitting: Primary Care

## 2024-02-10 ENCOUNTER — Ambulatory Visit: Payer: Self-pay | Admitting: Primary Care

## 2024-02-10 ENCOUNTER — Ambulatory Visit: Admitting: Primary Care

## 2024-02-10 ENCOUNTER — Encounter: Payer: Self-pay | Admitting: Primary Care

## 2024-02-10 VITALS — BP 120/82 | HR 69 | Temp 97.9°F | Ht 62.0 in | Wt 223.5 lb

## 2024-02-10 DIAGNOSIS — N1831 Chronic kidney disease, stage 3a: Secondary | ICD-10-CM

## 2024-02-10 DIAGNOSIS — Z7984 Long term (current) use of oral hypoglycemic drugs: Secondary | ICD-10-CM

## 2024-02-10 DIAGNOSIS — M1A09X0 Idiopathic chronic gout, multiple sites, without tophus (tophi): Secondary | ICD-10-CM

## 2024-02-10 DIAGNOSIS — E1122 Type 2 diabetes mellitus with diabetic chronic kidney disease: Secondary | ICD-10-CM

## 2024-02-10 DIAGNOSIS — Z7985 Long-term (current) use of injectable non-insulin antidiabetic drugs: Secondary | ICD-10-CM | POA: Diagnosis not present

## 2024-02-10 DIAGNOSIS — Z23 Encounter for immunization: Secondary | ICD-10-CM

## 2024-02-10 DIAGNOSIS — M1A4710 Other secondary chronic gout, right ankle and foot, without tophus (tophi): Secondary | ICD-10-CM

## 2024-02-10 LAB — BASIC METABOLIC PANEL WITH GFR
BUN: 22 mg/dL (ref 6–23)
CO2: 28 meq/L (ref 19–32)
Calcium: 9.5 mg/dL (ref 8.4–10.5)
Chloride: 103 meq/L (ref 96–112)
Creatinine, Ser: 1.01 mg/dL (ref 0.40–1.20)
GFR: 61.02 mL/min
Glucose, Bld: 88 mg/dL (ref 70–99)
Potassium: 4.9 meq/L (ref 3.5–5.1)
Sodium: 139 meq/L (ref 135–145)

## 2024-02-10 LAB — URIC ACID: Uric Acid, Serum: 6.9 mg/dL (ref 2.4–7.0)

## 2024-02-10 LAB — POCT GLYCOSYLATED HEMOGLOBIN (HGB A1C): Hemoglobin A1C: 5.2 % (ref 4.0–5.6)

## 2024-02-10 MED ORDER — BLOOD GLUCOSE TEST VI STRP: ORAL_STRIP | 3 refills | Status: AC

## 2024-02-10 MED ORDER — LANCET DEVICE MISC: 1.0000 | 0 refills | Status: AC

## 2024-02-10 MED ORDER — LANCETS MISC: 3 refills | Status: AC

## 2024-02-10 MED ORDER — BLOOD GLUCOSE MONITORING SUPPL DEVI: 1.0000 | 0 refills | Status: AC

## 2024-02-10 NOTE — Assessment & Plan Note (Addendum)
 Improved and controlled with A1C of 5.2!!!  Continue Mounjaro  12.5 mg weekly, metformin  ER 500 mg daily. Prescription for glucometer kit sent to pharmacy.  Discussed instructions for checking glucose levels  Follow-up in 6 months.

## 2024-02-10 NOTE — Progress Notes (Signed)
 "  Subjective:    Patient ID: Olivia Moreno, female    DOB: [birth year], 58 y.o.   MRN: [id number]  Olivia Moreno is a very pleasant 58 y.o. female with a history of hypertension, sleep apnea, type 2 diabetes, hyperlipidemia, gout who presents today for follow-up of diabetes.  1) Type 2 Diabetes:  Current medications include: Metformin  ER 500 mg daily, Mounjaro  12.5 mg weekly. She denies GI upset, nausea, constipation.   She is checking her blood glucose 0 times daily.   Last A1C: 5.8 in June 2025, 5.2 today Last Eye Exam: Up-to-date Last Foot Exam: Up-to-date Pneumonia Vaccination: 2019 Urine Microalbumin: Up-to-date Statin: Rosuvastatin   Dietary changes since last visit: Following with healthy weight and wellness in [place].    Exercise: None, plans to start in the new year.   BP Readings from Last 3 Encounters:  02/10/24 120/82  01/22/24 120/76  12/31/23 126/82   Wt Readings from Last 3 Encounters:  02/10/24 223 lb 8 oz (101.4 kg)  01/22/24 218 lb (98.9 kg)  12/31/23 220 lb (99.8 kg)   2) Chronic Gout: She continues to experience gout flarres. Currently occurring once monthly on average. Her pain is less intense since starting on allopurinol  50 mg daily but she continues to experience flares. Her flares occur to the elbows and right great toe. She does have a history of CKD. She takes colchicine  in the event of a flare which does help   Review of Systems  Respiratory:  Negative for shortness of breath.   Cardiovascular:  Negative for chest pain.  Gastrointestinal:  Negative for abdominal pain, constipation and nausea.  Neurological:  Negative for numbness.         Past Medical History:  Diagnosis Date   Back pain    COVID-19 virus infection 01/23/2021   Diabetes (HCC)    Gout    Hip pain    History of kidney problems    Hyperlipidemia    Hypertension    Hypertension    Joint pain    Knee pain    Sleep apnea    SOBOE (shortness of breath on  exertion)    Vitamin D  deficiency     Social History   Socioeconomic History   Marital status: Married    Spouse name: Eilleen Davoli   Number of children: 1   Years of education: Not on file   Highest education level: Some college, no degree  Occupational History   Occupation: unemployed    Associate Professor: UNEMPLOYED  Tobacco Use   Smoking status: Never   Smokeless tobacco: Never  Vaping Use   Vaping status: Never Used  Substance and Sexual Activity   Alcohol use: No   Drug use: No   Sexual activity: Not on file  Other Topics Concern   Not on file  Social History Narrative   Married.   1 child, 1 grandson.   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    Social Drivers of Health   Tobacco Use: Low Risk (02/10/2024)   Patient History    Smoking Tobacco Use: Never    Smokeless Tobacco Use: Never    Passive Exposure: Not on file  Financial Resource Strain: Low Risk (02/08/2024)   Overall Financial Resource Strain (CARDIA)    Difficulty of Paying Living Expenses: Not hard at all  Food Insecurity: No Food Insecurity (02/08/2024)   Epic    Worried About Radiation Protection Practitioner of [employer]  in the Last Year: Never true    Ran Out of Food in the Last Year: Never true  Transportation Needs: No Transportation Needs (02/08/2024)   Epic    Lack of Transportation (Medical): No    Lack of Transportation (Non-Medical): No  Physical Activity: Inactive (02/08/2024)   Exercise Vital Sign    Days of Exercise per Week: 0 days    Minutes of Exercise per Session: Not on file  Stress: No Stress Concern Present (02/08/2024)   [employer] of Occupational Health - Occupational Stress Questionnaire    Feeling of Stress: Not at all  Social Connections: Moderately Integrated (02/08/2024)   Social Connection and Isolation Panel    Frequency of Communication with Friends and Family: More than three times a week    Frequency of Social Gatherings with Friends and Family:  Once a week    Attends Religious Services: 1 to 4 times per year    Active Member of [employer] or Organizations: No    Attends Engineer, Structural: Not on file    Marital Status: Married  Catering Manager Violence: Not on file  Depression (PHQ2-9): Low Risk (09/09/2023)   Depression (PHQ2-9)    PHQ-2 Score: 1  Alcohol Screen: Not on file  Housing: Unknown (02/08/2024)   Epic    Unable to Pay for Housing in the Last Year: No    Number of Times Moved in the Last Year: Not on file    Homeless in the Last Year: No  Utilities: Not on file  Health Literacy: Not on file    Past Surgical History:  Procedure Laterality Date   BREAST BIOPSY Right 02/06/2022   Right Breast Stereo Bx, X Clip -  FIBROADENOMATOID CHANGE WITH ASSOCIATED   BREAST BIOPSY Right 02/06/2022   MM RT BREAST BX W LOC DEV 1ST LESION IMAGE BX SPEC STEREO GUIDE 02/06/2022 [facility]    Family History  Problem Relation Age of Onset   Hyperlipidemia Mother    Hypertension Mother    Heart attack Mother    Heart disease Mother    Obesity Mother    Hypertension Father    Pulmonary embolism Father    Pulmonary embolism Brother    Osteoarthritis Maternal Grandfather    Breast cancer Neg Hx     Allergies[1]  Medications Ordered Prior to Encounter[2]  BP 120/82   Pulse 69   Temp 97.9 F (36.6 C) (Oral)   Ht 5' 2 (1.575 m)   Wt 223 lb 8 oz (101.4 kg)   LMP 05/20/2010   SpO2 98%   BMI 40.88 kg/m  Objective:   Physical Exam Cardiovascular:     Rate and Rhythm: Normal rate and regular rhythm.  Pulmonary:     Effort: Pulmonary effort is normal.     Breath sounds: Normal breath sounds.  Musculoskeletal:     Cervical back: Neck supple.  Skin:    General: Skin is warm and dry.  Neurological:     Mental Status: She is alert and oriented to person, place, and time.  Psychiatric:        Mood and Affect: Mood normal.     Physical Exam        Assessment & Plan:  Type 2 diabetes mellitus  with stage 3a chronic kidney disease, without long-term current use of insulin  (HCC) Assessment & Plan: Improved and controlled with A1C of 5.2!!!  Continue Mounjaro  12.5 mg weekly, metformin  ER 500 mg daily. Prescription for glucometer kit sent to pharmacy.  Discussed instructions for checking glucose levels  Follow-up in 6 months.  Orders: -     POCT glycosylated hemoglobin (Hb A1C) -     Blood Glucose Monitoring Suppl; 1 each by Does not apply route as directed. Dispense based on patient and insurance preference. Use up to four times daily as directed. (FOR ICD-10 E10.9, E11.9).  Dispense: 1 each; Refill: 0 -     Blood Glucose Test; Used to check blood sugar once daily. dispense based on patient and insurance preference.  Dispense: 100 strip; Refill: 3 -     Lancet Device; 1 each by Does not apply route as directed. Dispense based on patient and insurance preference. Use up to four times daily as directed. (FOR ICD-10 E10.9, E11.9).  Dispense: 1 each; Refill: 0 -     Lancets; Used to check blood sugar once daily.  Dispense based on patient and insurance preference.  Dispense: 100 each; Refill: 3  Chronic gout of multiple sites, unspecified cause Assessment & Plan: Uncontrolled with recurrent flares.  Will recheck renal function to consider dose adjustment of allopurinol  Consider other options if renal function remains at current levels.  Repeat uric acid level pending.  Continue allopurinol  50 mg daily and colchicine  0.6 mg as needed  Orders: -     Uric acid -     Basic metabolic panel with GFR    Assessment and Plan Assessment & Plan         Comer MARLA Gaskins, NP       [1]  Allergies Allergen Reactions   Penicillins Rash    Reaction as a child  [2]  Current Outpatient Medications on File Prior to Visit  Medication Sig Dispense Refill   allopurinol  (ZYLOPRIM ) 100 MG tablet TAKE 1/2 TABLET(50 MG TOTAL) BY MOUTH DAILY. FOR GOUT PREVENTION 45 tablet 2    cetirizine (ZYRTEC) 10 MG tablet Take by mouth. As needed     colchicine  0.6 MG tablet TAKE 2 TABLETS BY MOUTH AT GOUT ONSET, REPEAT WITH 1 TABLET 2 HOURS LATER. THEN TAKE 1 TABLET TWICE DAILY THEREAFTER UNTIL GOUT FLARE RESOLVES. 30 tablet 0   Continuous Glucose Sensor (FREESTYLE LIBRE 3 SENSOR) MISC PLACE 1 SENSOR ON THE SKIN EVERY 14 DAYS. USE TO CHECK GLUCOSE CONTINUOUSLY 6 each 1   cyanocobalamin  (VITAMIN B12) 1000 MCG tablet Take 1 tablet (1,000 mcg total) by mouth daily. 30 tablet 0   metFORMIN  (GLUCOPHAGE -XR) 500 MG 24 hr tablet TAKE 1 TABLET (500 MG TOTAL) BY MOUTH DAILY WITH BREAKFAST. FOR DIABETES. 90 tablet 1   olmesartan  (BENICAR ) 40 MG tablet TAKE 1 TABLET (40 MG TOTAL) BY MOUTH DAILY FOR BLOOD PRESSURE 90 tablet 2   rosuvastatin  (CRESTOR ) 5 MG tablet TAKE 1 TABLET (5 MG TOTAL) BY MOUTH DAILY FOR CHOLESTEROL. 90 tablet 2   tirzepatide  (MOUNJARO ) 12.5 MG/0.5ML Pen Inject 12.5 mg into the skin once a week. 2 mL 0   Vitamin D , Ergocalciferol , (DRISDOL ) 1.25 MG (50000 UNIT) CAPS capsule Take 1 capsule by mouth once weekly for 12 weeks. 12 capsule 0   No current facility-administered medications on file prior to visit.   "

## 2024-02-10 NOTE — Assessment & Plan Note (Signed)
 Uncontrolled with recurrent flares.  Will recheck renal function to consider dose adjustment of allopurinol  Consider other options if renal function remains at current levels.  Repeat uric acid level pending.  Continue allopurinol  50 mg daily and colchicine  0.6 mg as needed

## 2024-02-10 NOTE — Addendum Note (Signed)
 Addended by: Doria Fern on: 02/10/2024 10:18 AM   Modules accepted: Orders

## 2024-02-10 NOTE — Patient Instructions (Addendum)
 Stop by the lab prior to leaving today. I will notify you of your results once received.   Start checking your blood sugar levels.  Appropriate times to check your blood sugar levels are:  -Before any meal (breakfast, lunch, dinner) -Two hours after any meal (breakfast, lunch, dinner) -Bedtime  Record your readings and notify me if you continue to consistently run at or above 130.   Please schedule a follow up visit for 6 months for a diabetes check.   It was a pleasure to see you today!

## 2024-02-18 ENCOUNTER — Other Ambulatory Visit: Payer: Self-pay | Admitting: Primary Care

## 2024-02-18 DIAGNOSIS — E1165 Type 2 diabetes mellitus with hyperglycemia: Secondary | ICD-10-CM

## 2024-02-18 MED ORDER — ALLOPURINOL 100 MG PO TABS: 100.0000 mg | ORAL_TABLET | Freq: Every day | ORAL | 1 refills | Status: AC

## 2024-02-18 MED ORDER — COLCHICINE 0.6 MG PO TABS: ORAL_TABLET | ORAL | 0 refills | Status: AC

## 2024-02-20 ENCOUNTER — Other Ambulatory Visit (INDEPENDENT_AMBULATORY_CARE_PROVIDER_SITE_OTHER): Payer: Self-pay | Admitting: Family Medicine

## 2024-02-26 ENCOUNTER — Encounter (INDEPENDENT_AMBULATORY_CARE_PROVIDER_SITE_OTHER): Admitting: Family Medicine

## 2024-02-26 ENCOUNTER — Encounter (INDEPENDENT_AMBULATORY_CARE_PROVIDER_SITE_OTHER): Payer: Self-pay | Admitting: Family Medicine

## 2024-02-26 MED ORDER — TIRZEPATIDE 12.5 MG/0.5ML ~~LOC~~ SOAJ: 12.5000 mg | SUBCUTANEOUS | 0 refills | Status: DC

## 2024-02-26 NOTE — Progress Notes (Incomplete Revision)
 "  Office: [phone]  /  Fax: [phone]  WEIGHT SUMMARY AND BIOMETRICS  Anthropometric Measurements Height: 5' 2 (1.575 m) Weight: 218 lb (98.9 kg) (Video visit, last in office weight) BMI (Calculated): 39.86 Starting Weight: 239 lb Peak Weight: 253 lb   No data recorded Other Clinical Data Fasting: N/A Labs: N/A Today's Visit #: 8 Starting Date: 09/09/23    Chief Complaint: OBESITY   Discussed the use of AI scribe software for clinical note transcription with the patient, who gave verbal consent to proceed.  History of Present Illness Olivia Moreno is a 59 year old female with obesity and type two diabetes who presents for obesity treatment and progress assessment.  She has been adhering to the category two eating plan approximately ninety percent of the time, focusing on adequate hydration and not skipping meals. She feels she is consuming the recommended amount of protein and has noticed weight loss over the past month, even during the holiday season. Although she is not currently exercising, she plans to start going to [employer] three days a week after work.  In addition to obesity, she is managing type two diabetes primarily through diet, exercise, and weight loss. She is taking Mounjaro  for her diabetes and requests a refill. Her blood sugars have been stable, and her A1c was recently measured at 5.2. No gastrointestinal issues such as nausea or constipation related to Mounjaro  are reported.      PHYSICAL EXAM:  Height 5' 2 (1.575 m), weight 218 lb (98.9 kg), last menstrual period 05/20/2010. Body mass index is 39.87 kg/m.  DIAGNOSTIC DATA REVIEWED:  BMET    Component Value Date/Time   NA 139 02/10/2024 0945   NA 140 09/09/2023 1009   K 4.9 02/10/2024 0945   CL 103 02/10/2024 0945   CO2 28 02/10/2024 0945   GLUCOSE 88 02/10/2024 0945   BUN 22 02/10/2024 0945   BUN 21 09/09/2023 1009   CREATININE 1.01 02/10/2024 0945   CREATININE 0.83  10/04/2011 1439   CALCIUM  9.5 02/10/2024 0945   Lab Results  Component Value Date   HGBA1C 5.2 02/10/2024   HGBA1C 6.2 11/15/2013   Lab Results  Component Value Date   INSULIN  67.1 (H) 09/09/2023   Lab Results  Component Value Date   TSH 2.070 09/09/2023   CBC    Component Value Date/Time   WBC 6.0 09/09/2023 1009   WBC 5.8 04/19/2022 0801   RBC 4.47 09/09/2023 1009   RBC 4.18 04/19/2022 0801   HGB 13.4 09/09/2023 1009   HCT 42.1 09/09/2023 1009   PLT 178 09/09/2023 1009   MCV 94 09/09/2023 1009   MCH 30.0 09/09/2023 1009   MCHC 31.8 09/09/2023 1009   MCHC 33.2 04/19/2022 0801   RDW 13.2 09/09/2023 1009   Iron Studies    Component Value Date/Time   IRON 77 04/19/2022 0801   TIBC 350.0 04/19/2022 0801   FERRITIN 155.2 04/19/2022 0801   IRONPCTSAT 22.0 04/19/2022 0801   Lipid Panel     Component Value Date/Time   CHOL 137 09/09/2023 1009   TRIG 111 09/09/2023 1009   HDL 50 09/09/2023 1009   CHOLHDL 2.7 09/09/2023 1009   CHOLHDL 3 07/25/2023 0833   VLDL 21.0 07/25/2023 0833   LDLCALC 67 09/09/2023 1009   LDLDIRECT 148.0 05/08/2017 0806   Hepatic Function Panel     Component Value Date/Time   PROT 7.0 09/09/2023 1009   ALBUMIN 4.4 09/09/2023 1009   AST 27 09/09/2023 1009  ALT 25 09/09/2023 1009   ALKPHOS 106 09/09/2023 1009   BILITOT 0.5 09/09/2023 1009      Component Value Date/Time   TSH 2.070 09/09/2023 1009   Nutritional Lab Results  Component Value Date   VD25OH 34.3 09/09/2023   VD25OH 34.92 07/25/2023   VD25OH 28.01 (L) 04/28/2023     Assessment and Plan Assessment & Plan Obesity Management is ongoing with adherence to the category two eating plan 90% of the time. She reports weight loss over the last month, including during the holidays. She is not currently exercising but plans to start going to [employer] three times a week after work. She is hydrating adequately and not skipping meals, feeling she is eating the recommended  amount of protein. No significant challenges anticipated in resuming routine post-holidays. - Continue category two eating plan - Encouraged exercise at [employer] three times a week - Advised to maintain hydration and adequate meal intake  Type 2 diabetes mellitus Managed with diet, exercise, and weight loss as primary treatments. She is on Mounjaro , which she reports no gastrointestinal side effects from. Her A1c has improved to 5.2, indicating good glycemic control. - Refilled Mounjaro  prescription - Continue diet, exercise, and weight loss as primary treatments      Patients who are on anti-obesity medications are counseled on the importance of maintaining healthy lifestyle habits, including balanced nutrition, regular physical activity, and behavioral modifications,  Medication is an adjunct to, not a replacement for, lifestyle changes and that the long-term success and weight maintenance depend on continued adherence to these strategies.   Marcene was informed of the importance of frequent follow up visits to maximize her success with intensive lifestyle modifications for her obesity and obesity related health conditions as recommended by USPSTF and CMS guidelines .  Louann Penton, MD   "

## 2024-02-26 NOTE — Progress Notes (Signed)
 No show

## 2024-03-12 ENCOUNTER — Other Ambulatory Visit (INDEPENDENT_AMBULATORY_CARE_PROVIDER_SITE_OTHER): Payer: Self-pay | Admitting: Family Medicine

## 2024-03-31 ENCOUNTER — Ambulatory Visit (INDEPENDENT_AMBULATORY_CARE_PROVIDER_SITE_OTHER): Admitting: Family Medicine

## 2024-08-10 ENCOUNTER — Encounter: Admitting: Primary Care
